# Patient Record
Sex: Male | Born: 1970 | Race: White | Hispanic: No | Marital: Married | Smoking: Former smoker
Health system: Southern US, Community
[De-identification: ages and names within clinical notes are randomized; demographics above are authoritative.]

## PROBLEM LIST (undated history)

## (undated) DIAGNOSIS — I10 Essential (primary) hypertension: Secondary | ICD-10-CM

## (undated) DIAGNOSIS — R519 Headache, unspecified: Secondary | ICD-10-CM

## (undated) HISTORY — PX: HERNIA REPAIR: SHX51

## (undated) HISTORY — DX: Headache, unspecified: R51.9

## (undated) HISTORY — DX: Essential (primary) hypertension: I10

## (undated) HISTORY — PX: JOINT REPLACEMENT: SHX530

---

## 2014-03-04 ENCOUNTER — Ambulatory Visit (INDEPENDENT_AMBULATORY_CARE_PROVIDER_SITE_OTHER): Payer: BC Managed Care – PPO | Admitting: Emergency Medicine

## 2014-03-04 VITALS — BP 118/82 | HR 74 | Temp 98.0°F | Resp 18 | Ht 72.0 in | Wt 247.8 lb

## 2014-03-04 DIAGNOSIS — H66012 Acute suppurative otitis media with spontaneous rupture of ear drum, left ear: Secondary | ICD-10-CM

## 2014-03-04 MED ORDER — AMOXICILLIN-POT CLAVULANATE 875-125 MG PO TABS: 1.0000 | ORAL_TABLET | Freq: Two times a day (BID) | ORAL | Status: DC

## 2014-03-04 MED ORDER — ACETAMINOPHEN-CODEINE #3 300-30 MG PO TABS: 1.0000 | ORAL_TABLET | ORAL | Status: DC | PRN

## 2014-03-04 NOTE — Progress Notes (Signed)
[facility] [address], [place] [id number] [phone]  Date:  03/04/2014   Name:  Leonard BreedingJeffrey Ngo   DOB:  Jun 23, 1970   MRN:  [id number]  PCP:  No PCP Per Patient    Chief Complaint: Otitis Media   History of Present Illness:  Leonard BreedingJeffrey Labrake is a 43 y.o. very pleasant male patient who presents with the following:  Several week history of a "cold".  Now worsening.  Has pain in left ear today Put hot oil in the ear and says it started to drain. No fevre or chills. Nasal congestion and mucopurulent drainage No cough, wheezing or shortness of breath. No improvement with over the counter medications or other home remedies.  Denies other complaint or health concern today.   There are no active problems to display for this patient.   History reviewed. No pertinent past medical history.  Past Surgical History  Procedure Laterality Date  . Hernia repair    . Joint replacement      History  Substance Use Topics  . Smoking status: Never Smoker   . Smokeless tobacco: Not on file  . Alcohol Use: No    Family History  Problem Relation Age of Onset  . Heart disease Mother   . Hyperlipidemia Mother   . Hypertension Mother   . Stroke Mother     No Known Allergies  Medication list has been reviewed and updated.  No current outpatient prescriptions on file prior to visit.   No current facility-administered medications on file prior to visit.    Review of Systems:  As per HPI, otherwise negative.    Physical Examination: Filed Vitals:   03/04/14 1053  BP: 118/82  Pulse: 74  Temp: 98 F (36.7 C)  Resp: 18   Filed Vitals:   03/04/14 1053  Height: 6' (1.829 m)  Weight: 247 lb 12.8 oz (112.401 kg)   Body mass index is 33.6 kg/(m^2). Ideal Body Weight: Weight in (lb) to have BMI = 25: 183.9  GEN: WDWN, NAD, Non-toxic, A & O x 3 HEENT: Atraumatic, Normocephalic. Neck supple. No masses, No LAD. Ears and Nose: No external deformity.   LEFT TM ruptured with purulent drainage CV: RRR, No M/G/R. No JVD. No thrill. No extra heart sounds. PULM: CTA B, no wheezes, crackles, rhonchi. No retractions. No resp. distress. No accessory muscle use. ABD: S, NT, ND, +BS. No rebound. No HSM. EXTR: No c/c/e NEURO Normal gait.  PSYCH: Normally interactive. Conversant. Not depressed or anxious appearing.  Calm demeanor.    Assessment and Plan: Acute OM with ruptured TM  Signed,  Phillips OdorJeffery Rivan Siordia, MD

## 2014-03-04 NOTE — Patient Instructions (Signed)
Tm Otitis Media Otitis media is redness, soreness, and inflammation of the middle ear. Otitis media may be caused by allergies or, most commonly, by infection. Often it occurs as a complication of the common cold. SIGNS AND SYMPTOMS Symptoms of otitis media may include:  Earache.  Fever.  Ringing in your ear.  Headache.  Leakage of fluid from the ear. DIAGNOSIS To diagnose otitis media, your health care provider will examine your ear with an otoscope. This is an instrument that allows your health care provider to see into your ear in order to examine your eardrum. Your health care provider also will ask you questions about your symptoms. TREATMENT  Typically, otitis media resolves on its own within 3-5 days. Your health care provider may prescribe medicine to ease your symptoms of pain. If otitis media does not resolve within 5 days or is recurrent, your health care provider may prescribe antibiotic medicines if he or she suspects that a bacterial infection is the cause. HOME CARE INSTRUCTIONS   If you were prescribed an antibiotic medicine, finish it all even if you start to feel better.  Take medicines only as directed by your health care provider.  Keep all follow-up visits as directed by your health care provider. SEEK MEDICAL CARE IF:  You have otitis media only in one ear, or bleeding from your nose, or both.  You notice a lump on your neck.  You are not getting better in 3-5 days.  You feel worse instead of better. SEEK IMMEDIATE MEDICAL CARE IF:   You have pain that is not controlled with medicine.  You have swelling, redness, or pain around your ear or stiffness in your neck.  You notice that part of your face is paralyzed.  You notice that the bone behind your ear (mastoid) is tender when you touch it. MAKE SURE YOU:   Understand these instructions.  Will watch your condition.  Will get help right away if you are not doing well or get worse. Document  Released: 12/29/2003 Document Revised: 08/09/2013 Document Reviewed: 10/20/2012 [facility] Patient Information 2015 [employer] [place]. This information is not intended to replace advice given to you by your health care provider. Make sure you discuss any questions you have with your health care provider.

## 2014-11-13 ENCOUNTER — Ambulatory Visit (INDEPENDENT_AMBULATORY_CARE_PROVIDER_SITE_OTHER): Payer: BLUE CROSS/BLUE SHIELD | Admitting: Emergency Medicine

## 2014-11-13 VITALS — BP 100/80 | HR 65 | Temp 98.2°F | Ht 72.5 in | Wt 260.2 lb

## 2014-11-13 DIAGNOSIS — G44209 Tension-type headache, unspecified, not intractable: Secondary | ICD-10-CM

## 2014-11-13 DIAGNOSIS — M545 Low back pain, unspecified: Secondary | ICD-10-CM

## 2014-11-13 LAB — POCT UA - MICROSCOPIC ONLY
Bacteria, U Microscopic: NEGATIVE
Casts, Ur, LPF, POC: NEGATIVE
Crystals, Ur, HPF, POC: NEGATIVE
Mucus, UA: POSITIVE
RBC, urine, microscopic: NEGATIVE
Yeast, UA: NEGATIVE

## 2014-11-13 LAB — POCT URINALYSIS DIPSTICK
Blood, UA: NEGATIVE
GLUCOSE UA: NEGATIVE
Leukocytes, UA: NEGATIVE
Nitrite, UA: NEGATIVE
Spec Grav, UA: 1.02
Urobilinogen, UA: 0.2
pH, UA: 6.5

## 2014-11-13 MED ORDER — NAPROXEN SODIUM 550 MG PO TABS: 550.0000 mg | ORAL_TABLET | Freq: Two times a day (BID) | ORAL | Status: DC

## 2014-11-13 MED ORDER — BUTALBITAL-APAP-CAFFEINE 50-325-40 MG PO TABS: 1.0000 | ORAL_TABLET | Freq: Four times a day (QID) | ORAL | Status: DC | PRN

## 2014-11-13 MED ORDER — CYCLOBENZAPRINE HCL 10 MG PO TABS
10.0000 mg | ORAL_TABLET | Freq: Three times a day (TID) | ORAL | Status: DC | PRN
Start: 2014-11-13 — End: 2015-01-14
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
## 2014-11-13 NOTE — Patient Instructions (Signed)
Lumbosacral Strain Lumbosacral strain is a strain of any of the parts that make up your lumbosacral vertebrae. Your lumbosacral vertebrae are the bones that make up the lower third of your backbone. Your lumbosacral vertebrae are held together by muscles and tough, fibrous tissue (ligaments).  CAUSES  A sudden blow to your back can cause lumbosacral strain. Also, anything that causes an excessive stretch of the muscles in the low back can cause this strain. This is typically seen when people exert themselves strenuously, fall, lift heavy objects, bend, or crouch repeatedly. RISK FACTORS  Physically demanding work.  Participation in pushing or pulling sports or sports that require a sudden twist of the back (tennis, golf, baseball).  Weight lifting.  Excessive lower back curvature.  Forward-tilted pelvis.  Weak back or abdominal muscles or both.  Tight hamstrings. SIGNS AND SYMPTOMS  Lumbosacral strain may cause pain in the area of your injury or pain that moves (radiates) down your leg.  DIAGNOSIS Your health care provider can often diagnose lumbosacral strain through a physical exam. In some cases, you may need tests such as X-ray exams.  TREATMENT  Treatment for your lower back injury depends on many factors that your clinician will have to evaluate. However, most treatment will include the use of anti-inflammatory medicines. HOME CARE INSTRUCTIONS   Avoid hard physical activities (tennis, racquetball, waterskiing) if you are not in proper physical condition for it. This may aggravate or create problems.  If you have a back problem, avoid sports requiring sudden body movements. Swimming and walking are generally safer activities.  Maintain good posture.  Maintain a healthy weight.  For acute conditions, you may put ice on the injured area.  Put ice in a plastic bag.  Place a towel between your skin and the bag.  Leave the ice on for 20 minutes, 2-3 times a day.  When the  low back starts healing, stretching and strengthening exercises may be recommended. SEEK MEDICAL CARE IF:  Your back pain is getting worse.  You experience severe back pain not relieved with medicines. SEEK IMMEDIATE MEDICAL CARE IF:   You have numbness, tingling, weakness, or problems with the use of your arms or legs.  There is a change in bowel or bladder control.  You have increasing pain in any area of the body, including your belly (abdomen).  You notice shortness of breath, dizziness, or feel faint.  You feel sick to your stomach (nauseous), are throwing up (vomiting), or become sweaty.  You notice discoloration of your toes or legs, or your feet get very cold. MAKE SURE YOU:   Understand these instructions.  Will watch your condition.  Will get help right away if you are not doing well or get worse. Document Released: 01/02/2005 Document Revised: 03/30/2013 Document Reviewed: 11/11/2012 [facility] Patient Information 2015 [place], [place]. This information is not intended to replace advice given to you by your health care provider. Make sure you discuss any questions you have with your health care provider. Tension Headache A tension headache is a feeling of pain, pressure, or aching often felt over the front and sides of the head. The pain can be dull or can feel tight (constricting). It is the most common type of headache. Tension headaches are not normally associated with nausea or vomiting and do not get worse with physical activity. Tension headaches can last 30 minutes to several days.  CAUSES  The exact cause is not known, but it may be caused by chemicals and hormones  in the brain that lead to pain. Tension headaches often begin after stress, anxiety, or depression. Other triggers may include:  Alcohol.  Caffeine (too much or withdrawal).  Respiratory infections (colds, flu, sinus infections).  Dental problems or teeth clenching.  Fatigue.  Holding your head  and neck in one position too long while using a computer. SYMPTOMS   Pressure around the head.   Dull, aching head pain.   Pain felt over the front and sides of the head.   Tenderness in the muscles of the head, neck, and shoulders. DIAGNOSIS  A tension headache is often diagnosed based on:   Symptoms.   Physical examination.   A CT scan or MRI of your head. These tests may be ordered if symptoms are severe or unusual. TREATMENT  Medicines may be given to help relieve symptoms.  HOME CARE INSTRUCTIONS   Only take over-the-counter or prescription medicines for pain or discomfort as directed by your caregiver.   Lie down in a dark, quiet room when you have a headache.   Keep a journal to find out what may be triggering your headaches. For example, write down:  What you eat and drink.  How much sleep you get.  Any change to your diet or medicines.  Try massage or other relaxation techniques.   Ice packs or heat applied to the head and neck can be used. Use these 3 to 4 times per day for 15 to 20 minutes each time, or as needed.   Limit stress.   Sit up straight, and do not tense your muscles.   Quit smoking if you smoke.  Limit alcohol use.  Decrease the amount of caffeine you drink, or stop drinking caffeine.  Eat and exercise regularly.  Get 7 to 9 hours of sleep, or as recommended by your caregiver.  Avoid excessive use of pain medicine as recurrent headaches can occur.  SEEK MEDICAL CARE IF:   You have problems with the medicines you were prescribed.  Your medicines do not work.  You have a change from the usual headache.  You have nausea or vomiting. SEEK IMMEDIATE MEDICAL CARE IF:   Your headache becomes severe.  You have a fever.  You have a stiff neck.  You have loss of vision.  You have muscular weakness or loss of muscle control.  You lose your balance or have trouble walking.  You feel faint or pass out.  You have  severe symptoms that are different from your first symptoms. MAKE SURE YOU:   Understand these instructions.  Will watch your condition.  Will get help right away if you are not doing well or get worse. Document Released: 03/25/2005 Document Revised: 06/17/2011 Document Reviewed: 03/15/2011 [facility] Patient Information 2015 [place], [place]. This information is not intended to replace advice given to you by your health care provider. Make sure you discuss any questions you have with your health care provider.

## 2015-01-07 ENCOUNTER — Ambulatory Visit (INDEPENDENT_AMBULATORY_CARE_PROVIDER_SITE_OTHER): Payer: BLUE CROSS/BLUE SHIELD | Admitting: Family Medicine

## 2015-01-07 VITALS — BP 122/70 | HR 79 | Temp 98.6°F | Resp 16 | Ht 72.5 in | Wt 272.0 lb

## 2015-01-07 DIAGNOSIS — J209 Acute bronchitis, unspecified: Secondary | ICD-10-CM | POA: Diagnosis not present

## 2015-01-07 MED ORDER — ALBUTEROL SULFATE HFA 108 (90 BASE) MCG/ACT IN AERS: 2.0000 | INHALATION_SPRAY | RESPIRATORY_TRACT | Status: DC | PRN

## 2015-01-07 MED ORDER — AZITHROMYCIN 250 MG PO TABS: ORAL_TABLET | ORAL | Status: DC

## 2015-01-07 MED ORDER — HYDROCODONE-HOMATROPINE 5-1.5 MG/5ML PO SYRP
5.0000 mL | ORAL_SOLUTION | Freq: Three times a day (TID) | ORAL | Status: DC | PRN
Start: 2015-01-07 — End: 2015-01-14

## 2015-01-07 NOTE — Progress Notes (Signed)
Patient ID: Leonard Villegas MRN: [id number], DOB: [birth year], 44 y.o. Date of Encounter: 01/07/2015, 1:42 PM  Primary Physician: No PCP Per Patient  Chief Complaint:  Chief Complaint  Patient presents with  . Cough    x 3 weeks  . Nasal Congestion    HPI: 44 y.o. year old male presents with a 21 day history of nasal congestion, post nasal drip, sore throat, and cough. Mild sinus pressure. Afebrile. No chills. Nasal congestion thick and green/yellow. Cough is productive of green/yellow sputum and not associated with time of day. Ears feel full, leading to sensation of muffled hearing. Has tried OTC cold preps without success. No GI complaints.   No sick contacts, recent antibiotics, or recent travels.   No leg trauma, sedentary periods, h/o cancer, or tobacco use.  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  History reviewed. No pertinent past medical history.   Home Meds: Prior to Admission medications   Medication Sig Start Date End Date Taking? Authorizing Provider  butalbital-acetaminophen-caffeine (FIORICET) 50-325-40 MG per tablet Take 1-2 tablets by mouth every 6 (six) hours as needed for headache. 11/13/14 11/13/15 Yes Carmelina Dane, MD  cyclobenzaprine (FLEXERIL) 10 MG tablet Take 1 tablet (10 mg total) by mouth 3 (three) times daily as needed for muscle spasms. 11/13/14  Yes Carmelina Dane, MD  ibuprofen (ADVIL,MOTRIN) 200 MG tablet Take 200 mg by mouth every 6 (six) hours as needed.   Yes Historical Provider, MD  naproxen sodium (ANAPROX DS) 550 MG tablet Take 1 tablet (550 mg total) by mouth 2 (two) times daily with a meal. 11/13/14 11/13/15 Yes Carmelina Dane, MD  albuterol (PROVENTIL HFA;VENTOLIN HFA) 108 (90 BASE) MCG/ACT inhaler Inhale 2 puffs into the lungs every 4 (four) hours as needed for wheezing or shortness of breath (cough, shortness of breath or wheezing.). 01/07/15   Elvina Sidle, MD  azithromycin (ZITHROMAX) 250 MG tablet Take 2 tabs  PO x 1 dose, then 1 tab PO QD x 4 days 01/07/15   Elvina Sidle, MD  HYDROcodone-homatropine [facility]) 5-1.5 MG/5ML syrup Take 5 mLs by mouth every 8 (eight) hours as needed for cough. 01/07/15   Elvina Sidle, MD    Allergies: No Known Allergies  Social History   Social History  . Marital Status: Single    Spouse Name: N/A  . Number of Children: N/A  . Years of Education: N/A   Occupational History  . Not on file.   Social History Main Topics  . Smoking status: Never Smoker   . Smokeless tobacco: Not on file  . Alcohol Use: No  . Drug Use: No  . Sexual Activity: Not on file   Other Topics Concern  . Not on file   Social History Narrative     Review of Systems: Constitutional: negative for chills, fever, night sweats or weight changes Cardiovascular: negative for chest pain or palpitations Respiratory: negative for hemoptysis, wheezing, or shortness of breath Abdominal: negative for abdominal pain, nausea, vomiting or diarrhea Dermatological: negative for rash Neurologic: negative for headache   Physical Exam: Blood pressure 122/70, pulse 79, temperature 98.6 F (37 C), temperature source Oral, resp. rate 16, height 6' 0.5" (1.842 m), weight 272 lb (123.378 kg), SpO2 97 %., Body mass index is 36.36 kg/(m^2). General: Well developed, well nourished, in no acute distress. Head: Normocephalic, atraumatic, eyes without discharge, sclera non-icteric, nares are congested. Bilateral auditory canals clear, TM's are without perforation, pearly grey with reflective cone of  light bilaterally. No sinus TTP. Oral cavity moist, dentition normal. Posterior pharynx with post nasal drip and mild erythema. No peritonsillar abscess or tonsillar exudate. Neck: Supple. No thyromegaly. Full ROM. No lymphadenopathy. Lungs: Coarse breath sounds bilaterally without wheezes, rales, or rhonchi. Breathing is unlabored.  Heart: RRR with S1 S2. No murmurs, rubs, or gallops appreciated. Msk:   Strength and tone normal for age. Extremities: No clubbing or cyanosis. No edema. Neuro: Alert and oriented X 3. Moves all extremities spontaneously. CNII-XII grossly in tact. Psych:  Responds to questions appropriately with a normal affect.   Labs:   ASSESSMENT AND PLAN:  44 y.o. year old male with bronchitis. -   ICD-9-CM ICD-10-CM   1. Acute bronchitis, unspecified organism 466.0 J20.9 azithromycin (ZITHROMAX) 250 MG tablet     albuterol (PROVENTIL HFA;VENTOLIN HFA) 108 (90 BASE) MCG/ACT inhaler     HYDROcodone-homatropine (HYCODAN) 5-1.5 MG/5ML syrup   -Tylenol/Motrin prn -Rest/fluids -RTC precautions -RTC 3-5 days if no improvement  Signed, Elvina Sidle, MD 01/07/2015 1:42 PM

## 2015-01-07 NOTE — Patient Instructions (Signed)

## 2015-01-11 ENCOUNTER — Telehealth: Payer: Self-pay

## 2015-01-11 DIAGNOSIS — J209 Acute bronchitis, unspecified: Secondary | ICD-10-CM

## 2015-01-11 MED ORDER — AZITHROMYCIN 250 MG PO TABS: ORAL_TABLET | ORAL | Status: DC

## 2015-01-11 NOTE — Telephone Encounter (Signed)
Pt states he forgot and left his last antibiotic at home and is still coughing a lot would like to have another round of the medicine and he can't afford to come back in Please call pt at [phone] and when we call him back, he would like to give [place] a different pharmacy [place]

## 2015-01-12 NOTE — Telephone Encounter (Signed)
What can we do for pt?

## 2015-01-13 NOTE — Telephone Encounter (Signed)
No refills.  Lets have patient try Zyrtec daily for now. Per Lauenstein's note he was advised to RTC in 3-5 days for recheck if no better.  Vitals were normal at last visit.  Risk of more antibiotic not outweighed by the benefit at this point.  Deliah Boston, MS, PA-C   10:03 AM, 01/13/2015

## 2015-01-14 ENCOUNTER — Telehealth: Payer: Self-pay

## 2015-01-14 ENCOUNTER — Ambulatory Visit (INDEPENDENT_AMBULATORY_CARE_PROVIDER_SITE_OTHER): Payer: BLUE CROSS/BLUE SHIELD | Admitting: Family Medicine

## 2015-01-14 DIAGNOSIS — H8110 Benign paroxysmal vertigo, unspecified ear: Secondary | ICD-10-CM | POA: Diagnosis not present

## 2015-01-14 DIAGNOSIS — R05 Cough: Secondary | ICD-10-CM | POA: Diagnosis not present

## 2015-01-14 DIAGNOSIS — J209 Acute bronchitis, unspecified: Secondary | ICD-10-CM | POA: Diagnosis not present

## 2015-01-14 DIAGNOSIS — R059 Cough, unspecified: Secondary | ICD-10-CM

## 2015-01-14 MED ORDER — ALBUTEROL SULFATE HFA 108 (90 BASE) MCG/ACT IN AERS: 2.0000 | INHALATION_SPRAY | RESPIRATORY_TRACT | Status: DC | PRN

## 2015-01-14 MED ORDER — CEFDINIR 300 MG PO CAPS: 600.0000 mg | ORAL_CAPSULE | Freq: Every day | ORAL | Status: DC

## 2015-01-14 MED ORDER — MECLIZINE HCL 25 MG PO TABS: 25.0000 mg | ORAL_TABLET | Freq: Three times a day (TID) | ORAL | Status: DC | PRN

## 2015-01-14 NOTE — Progress Notes (Signed)
Patient ID: Leonard Villegas, male    DOB: [birth year]  Age: 44 y.o. MRN: [id number]  Chief Complaint  Patient presents with  . Dizziness    Onset today    Subjective:   Patient had a very strange episode for him this morning. He awoke and was going to get up and got extremely acutely dizzy. He had a room spinning sensation. After while he sat up to thinking that getting out might make it better, and he did transiently but then it got worse again. Periodically as he is moving around her tried to lay back down he has had recurrent dizziness. He has not had much in way of nausea. No vomiting. He still has a residual cough from last week. He never got his inhaler filled. He did take the antibiotics can call back for refill of antibiotics which is given him. However the pharmacy stated that the insurance currently refused to pay for azithromycin for him to cause he just a course of it. No other major associated symptoms. No ringing in the year. No headache. He does have a history of having had a number of ear surgeries when he was young.  He works [place] in [place], was down here to visit his girlfriend last night. Drinks alcohol though he did have a couple drinks on Wednesday night.  Current allergies, medications, problem list, past/family and social histories reviewed.  Objective:  BP 121/84 mmHg  Pulse 101  Temp(Src) 99.4 F (37.4 C) (Oral)  Resp 16  Ht 6' 0.5" (1.842 m)  Wt 265 lb (120.203 kg)  BMI 35.43 kg/m2  SpO2 98%  No major acute distress. He looks anxious. TMs both have a good deal of scarring. His eyes are PERRLA. Fundi benign. EOMs intact. When I laid him down he did develop left nystagmus. It was very transient. His throat is clear. Neck supple without nodes or thyromegaly. Chest clear. Heart regular without murmurs.  Assessment & Plan:   Assessment: 1. Benign paroxysmal positional vertigo, unspecified laterality   2. Cough   3. Acute bronchitis, unspecified organism        Plan:   Meds ordered this encounter  Medications  . cefdinir (OMNICEF) 300 MG capsule    Sig: Take 2 capsules (600 mg total) by mouth daily.    Dispense:  14 capsule    Refill:  0  . meclizine (ANTIVERT) 25 MG tablet    Sig: Take 1 tablet (25 mg total) by mouth 3 (three) times daily as needed.    Dispense:  30 tablet    Refill:  0  . albuterol (PROVENTIL HFA;VENTOLIN HFA) 108 (90 BASE) MCG/ACT inhaler    Sig: Inhale 2 puffs into the lungs every 4 (four) hours as needed for wheezing or shortness of breath (cough, shortness of breath or wheezing.).    Dispense:  1 Inhaler    Refill:  0         Patient Instructions  Take meclizine 25 mg 1 or 2 pills 3 times daily as needed for dizziness  Drink lots of fluids  Take Omnicef (cefdinir) one pill twice daily for infection  Use the inhaler as previously directed  Return if worse or not improving   Benign Positional Vertigo Vertigo is the feeling that you or your surroundings are moving when they are not. Benign positional vertigo is the most common form of vertigo. The cause of this condition is not serious (is benign). This condition is triggered by certain movements and positions (is  positional). This condition can be dangerous if it occurs while you are doing something that could endanger you or others, such as driving.  CAUSES In many cases, the cause of this condition is not known. It may be caused by a disturbance in an area of the inner ear that helps your brain to sense movement and balance. This disturbance can be caused by a viral infection (labyrinthitis), head injury, or repetitive motion. RISK FACTORS This condition is more likely to develop in:  Women.  People who are 86 years of age or older. SYMPTOMS Symptoms of this condition usually happen when you move your head or your eyes in different directions. Symptoms may start suddenly, and they usually last for less than a minute. Symptoms may  include:  Loss of balance and falling.  Feeling like you are spinning or moving.  Feeling like your surroundings are spinning or moving.  Nausea and vomiting.  Blurred vision.  Dizziness.  Involuntary eye movement (nystagmus). Symptoms can be mild and cause only slight annoyance, or they can be severe and interfere with daily life. Episodes of benign positional vertigo may return (recur) over time, and they may be triggered by certain movements. Symptoms may improve over time. DIAGNOSIS This condition is usually diagnosed by medical history and a physical exam of the head, neck, and ears. You may be referred to a health care provider who specializes in ear, nose, and throat (ENT) problems (otolaryngologist) or a provider who specializes in disorders of the nervous system (neurologist). You may have additional testing, including:  MRI.  A CT scan.  Eye movement tests. Your health care provider may ask you to change positions quickly while he or she watches you for symptoms of benign positional vertigo, such as nystagmus. Eye movement may be tested with an electronystagmogram (ENG), caloric stimulation, the Dix-Hallpike test, or the roll test.  An electroencephalogram (EEG). This records electrical activity in your brain.  Hearing tests. TREATMENT Usually, your health care provider will treat this by moving your head in specific positions to adjust your inner ear back to normal. Surgery may be needed in severe cases, but this is rare. In some cases, benign positional vertigo may resolve on its own in 2-4 weeks. HOME CARE INSTRUCTIONS Safety  Move slowly.Avoid sudden body or head movements.  Avoid driving.  Avoid operating heavy machinery.  Avoid doing any tasks that would be dangerous to you or others if a vertigo episode would occur.  If you have trouble walking or keeping your balance, try using a cane for stability. If you feel dizzy or unstable, sit down right  away.  Return to your normal activities as told by your health care provider. Ask your health care provider what activities are safe for you. General Instructions  Take over-the-counter and prescription medicines only as told by your health care provider.  Avoid certain positions or movements as told by your health care provider.  Drink enough fluid to keep your urine clear or pale yellow.  Keep all follow-up visits as told by your health care provider. This is important. SEEK MEDICAL CARE IF:  You have a fever.  Your condition gets worse or you develop new symptoms.  Your family or friends notice any behavioral changes.  Your nausea or vomiting gets worse.  You have numbness or a "pins and needles" sensation. SEEK IMMEDIATE MEDICAL CARE IF:  You have difficulty speaking or moving.  You are always dizzy.  You faint.  You develop severe headaches.  You have weakness in your legs or arms.  You have changes in your hearing or vision.  You develop a stiff neck.  You develop sensitivity to light.   This information is not intended to replace advice given to you by your health care provider. Make sure you discuss any questions you have with your health care provider.   Document Released: 12/31/2005 Document Revised: 12/14/2014 Document Reviewed: 07/18/2014 Elsevier Interactive Patient Education [employer]      Return if symptoms worsen or fail to improve.   Annell Canty, MD 01/14/2015

## 2015-01-14 NOTE — Patient Instructions (Signed)
Take meclizine 25 mg 1 or 2 pills 3 times daily as needed for dizziness  Drink lots of fluids  Take Omnicef (cefdinir) one pill twice daily for infection  Use the inhaler as previously directed  Return if worse or not improving   Benign Positional Vertigo Vertigo is the feeling that you or your surroundings are moving when they are not. Benign positional vertigo is the most common form of vertigo. The cause of this condition is not serious (is benign). This condition is triggered by certain movements and positions (is positional). This condition can be dangerous if it occurs while you are doing something that could endanger you or others, such as driving.  CAUSES In many cases, the cause of this condition is not known. It may be caused by a disturbance in an area of the inner ear that helps your brain to sense movement and balance. This disturbance can be caused by a viral infection (labyrinthitis), head injury, or repetitive motion. RISK FACTORS This condition is more likely to develop in:  Women.  People who are 54 years of age or older. SYMPTOMS Symptoms of this condition usually happen when you move your head or your eyes in different directions. Symptoms may start suddenly, and they usually last for less than a minute. Symptoms may include:  Loss of balance and falling.  Feeling like you are spinning or moving.  Feeling like your surroundings are spinning or moving.  Nausea and vomiting.  Blurred vision.  Dizziness.  Involuntary eye movement (nystagmus). Symptoms can be mild and cause only slight annoyance, or they can be severe and interfere with daily life. Episodes of benign positional vertigo may return (recur) over time, and they may be triggered by certain movements. Symptoms may improve over time. DIAGNOSIS This condition is usually diagnosed by medical history and a physical exam of the head, neck, and ears. You may be referred to a health care provider who  specializes in ear, nose, and throat (ENT) problems (otolaryngologist) or a provider who specializes in disorders of the nervous system (neurologist). You may have additional testing, including:  MRI.  A CT scan.  Eye movement tests. Your health care provider may ask you to change positions quickly while he or she watches you for symptoms of benign positional vertigo, such as nystagmus. Eye movement may be tested with an electronystagmogram (ENG), caloric stimulation, the Dix-Hallpike test, or the roll test.  An electroencephalogram (EEG). This records electrical activity in your brain.  Hearing tests. TREATMENT Usually, your health care provider will treat this by moving your head in specific positions to adjust your inner ear back to normal. Surgery may be needed in severe cases, but this is rare. In some cases, benign positional vertigo may resolve on its own in 2-4 weeks. HOME CARE INSTRUCTIONS Safety  Move slowly.Avoid sudden body or head movements.  Avoid driving.  Avoid operating heavy machinery.  Avoid doing any tasks that would be dangerous to you or others if a vertigo episode would occur.  If you have trouble walking or keeping your balance, try using a cane for stability. If you feel dizzy or unstable, sit down right away.  Return to your normal activities as told by your health care provider. Ask your health care provider what activities are safe for you. General Instructions  Take over-the-counter and prescription medicines only as told by your health care provider.  Avoid certain positions or movements as told by your health care provider.  Drink enough fluid to  keep your urine clear or pale yellow.  Keep all follow-up visits as told by your health care provider. This is important. SEEK MEDICAL CARE IF:  You have a fever.  Your condition gets worse or you develop new symptoms.  Your family or friends notice any behavioral changes.  Your nausea or vomiting  gets worse.  You have numbness or a "pins and needles" sensation. SEEK IMMEDIATE MEDICAL CARE IF:  You have difficulty speaking or moving.  You are always dizzy.  You faint.  You develop severe headaches.  You have weakness in your legs or arms.  You have changes in your hearing or vision.  You develop a stiff neck.  You develop sensitivity to light.   This information is not intended to replace advice given to you by your health care provider. Make sure you discuss any questions you have with your health care provider.   Document Released: 12/31/2005 Document Revised: 12/14/2014 Document Reviewed: 07/18/2014 Elsevier Interactive Patient Education [employer]

## 2015-01-14 NOTE — Telephone Encounter (Signed)
[facility] is calling because the patient needed a prescription for z pak and the insurance didn't cover it. They would like to know if that patient can be prescribed an alternative.

## 2015-01-18 NOTE — Telephone Encounter (Signed)
I do not know why this only got to my box today, the 11th when sent the 8th?? .  Regardless, it has been a week since Dr. Elbert EwingsL wrote the prescription.  Call patient.  Find out if still having problems.  If need ask Dr L for an RX since he saw the patient.

## 2015-01-19 NOTE — Telephone Encounter (Signed)
I have called patient,he did not answer, but it looks like this was already done, there is a Rx for Omnicef in the Rx module which was given just after the Zpack  To you FYI

## 2015-03-04 ENCOUNTER — Other Ambulatory Visit: Payer: Self-pay | Admitting: Family Medicine

## 2015-03-04 ENCOUNTER — Ambulatory Visit (INDEPENDENT_AMBULATORY_CARE_PROVIDER_SITE_OTHER): Payer: BLUE CROSS/BLUE SHIELD | Admitting: Family Medicine

## 2015-03-04 VITALS — BP 118/82 | HR 90 | Temp 99.0°F | Resp 16 | Ht 72.5 in | Wt 270.0 lb

## 2015-03-04 DIAGNOSIS — Z113 Encounter for screening for infections with a predominantly sexual mode of transmission: Secondary | ICD-10-CM | POA: Diagnosis not present

## 2015-03-04 NOTE — Patient Instructions (Signed)
I will be in touch with your results asap Call me if any other concerns

## 2015-03-04 NOTE — Progress Notes (Addendum)
[facility] [address], [place] [address] [phone]  Date:  03/04/2015   Name:  Leonard BreedingJeffrey Villegas   DOB:  [birth year]   MRN:  [id number]  PCP:  No PCP Per Patient    Chief Complaint: STD testing   History of Present Illness:  Leonard Villegas is a 44 y.o. very pleasant male patient who presents with the following:  Generally heathy established pt here today with concern of STI testing.   He thinks he could have been exposed to HSV in the past- he would like to be tested for that.    There are no active problems to display for this patient.   No past medical history on file.  Past Surgical History  Procedure Laterality Date  . Hernia repair    . Joint replacement      Social History  Substance Use Topics  . Smoking status: Never Smoker   . Smokeless tobacco: None  . Alcohol Use: No    Family History  Problem Relation Age of Onset  . Heart disease Mother   . Hyperlipidemia Mother   . Hypertension Mother   . Stroke Mother     No Known Allergies  Medication list has been reviewed and updated.  Current Outpatient Prescriptions on File Prior to Visit  Medication Sig Dispense Refill  . ibuprofen (ADVIL,MOTRIN) 200 MG tablet Take 200 mg by mouth every 6 (six) hours as needed.     No current facility-administered medications on file prior to visit.    Review of Systems:  As per HPI- otherwise negative.   Physical Examination: Filed Vitals:   03/04/15 1205  BP: 118/82  Pulse: 90  Temp: 99 F (37.2 C)  Resp: 16   Filed Vitals:   03/04/15 1205  Height: 6' 0.5" (1.842 m)  Weight: 270 lb (122.471 kg)   Body mass index is 36.1 kg/(m^2). Ideal Body Weight: Weight in (lb) to have BMI = 25: 186.5  GEN: WDWN, NAD, Non-toxic, A & O x 3 HEENT: Atraumatic, Normocephalic. Neck supple. No masses, No LAD. Ears and Nose: No external deformity. CV: RRR, No M/G/R. No JVD. No thrill. No extra heart sounds. PULM: CTA B, no wheezes,  crackles, rhonchi. No retractions. No resp. distress. No accessory muscle use. ABD: S, NT, ND, +BS. No rebound. No HSM. EXTR: No c/c/e NEURO Normal gait.  PSYCH: Normally interactive. Conversant. Not depressed or anxious appearing.  Calm demeanor.    Assessment and Plan: Routine screening for STI (sexually transmitted infection) - Plan: HSV(herpes simplex vrs) 1+2 ab-IgG  [facility] Here today seeking HSV testing only- declines other labs  Signed Abbe AmsterdamJessica Donielle Radziewicz, MD  Called 11/29- [facility] that labs are negative, I will mail him a copy Results for orders placed or performed in visit on 03/04/15  HSV(herpes simplex vrs) 1+2 ab-IgG  Result Value Ref Range   HSV 1 Glycoprotein G Ab, IgG <0.10 IV   HSV 2 Glycoprotein G Ab, IgG <0.10 IV

## 2015-03-06 ENCOUNTER — Telehealth: Payer: Self-pay

## 2015-03-06 NOTE — Telephone Encounter (Signed)
Is it possible to add on an HIV- please do so if we can and give him a call- [facility] test [facility] added (or not if cannot add)

## 2015-03-06 NOTE — Telephone Encounter (Signed)
Pt requesting we add HIV test to his lab work,he would like a call back letting Him know if we have done that   Best phone for pt is [phone]

## 2015-03-06 NOTE — Telephone Encounter (Signed)
[facility]

## 2015-03-07 ENCOUNTER — Encounter: Payer: Self-pay | Admitting: Family Medicine

## 2015-03-07 LAB — HSV(HERPES SIMPLEX VRS) I + II AB-IGG: HSV 1 Glycoprotein G Ab, IgG: <0.1 IV

## 2015-03-07 LAB — HIV ANTIBODY (ROUTINE TESTING W REFLEX): HIV 1&2 Ab, 4th Generation: NONREACTIVE

## 2015-06-10 ENCOUNTER — Ambulatory Visit (INDEPENDENT_AMBULATORY_CARE_PROVIDER_SITE_OTHER): Payer: BLUE CROSS/BLUE SHIELD | Admitting: Internal Medicine

## 2015-06-10 VITALS — BP 122/84 | HR 100 | Temp 98.7°F | Resp 16 | Ht 72.5 in | Wt 274.0 lb

## 2015-06-10 DIAGNOSIS — J988 Other specified respiratory disorders: Secondary | ICD-10-CM | POA: Diagnosis not present

## 2015-06-10 DIAGNOSIS — J22 Unspecified acute lower respiratory infection: Secondary | ICD-10-CM

## 2015-06-10 MED ORDER — PREDNISONE 20 MG PO TABS: ORAL_TABLET | ORAL | Status: DC

## 2015-06-10 MED ORDER — AZITHROMYCIN 500 MG PO TABS: 500.0000 mg | ORAL_TABLET | Freq: Every day | ORAL | Status: DC

## 2015-06-10 NOTE — Progress Notes (Signed)
   Subjective:  By signing my name below, I, Stann Oresung-Kai Tsai, attest that this documentation has been prepared under the direction and in the presence of Ellamae Siaobert Rosalyn Archambault, MD. Electronically Signed: Stann Oresung-Kai Tsai, Scribe. 06/10/2015 , 2:51 PM .  Patient was seen in Room 6 .   Patient ID: Leonard Villegas, male    DOB: 1971/02/11, 45 y.o.   MRN: [id number] Chief Complaint  Patient presents with  . Cough    x 2-3 wks, productive  . Shortness of Breath    sometimes over the past few weeks   HPI Leonard Villegas is a 45 y.o. male who [facility] of productive cough that started about 2-3 weeks ago. He also reports having some shortness of breath with some myalgia over the past few weeks. He's had URI troubles in the past. He also reports ear fullness in both of his ears (right worse than left). He denies fever, chills, facial swelling, or blowing out much. He denies being on any long term medications.   There are no active problems to display for this patient.   Current outpatient prescriptions:  .  azithromycin (ZITHROMAX) 500 MG tablet, Take 1 tablet (500 mg total) by mouth daily., Disp: 5 tablet, Rfl: 0 .  predniSONE (DELTASONE) 20 MG tablet, 3/3/2/2/1/1 single daily dose for 6 days, Disp: 12 tablet, Rfl: 0  Review of Systems  Constitutional: Positive for fatigue. Negative for fever, chills and activity change.  HENT: Positive for ear pain and hearing loss. Negative for congestion, facial swelling, rhinorrhea and sore throat.   Respiratory: Positive for cough and shortness of breath. Negative for wheezing.   Musculoskeletal: Positive for myalgias.      Objective:   Physical Exam  Constitutional: He is oriented to person, place, and time. He appears well-developed and well-nourished. No distress.  HENT:  Head: Normocephalic and atraumatic.  Right Ear: A middle ear effusion is present.  Left Ear: A middle ear effusion is present.  Nose: Nose normal.  Mouth/Throat:  Oropharynx is clear and moist and mucous membranes are normal.  Boggy turbinates  Eyes: EOM are normal. Pupils are equal, round, and reactive to light.  Neck: Neck supple. No thyromegaly present.  Cardiovascular: Normal rate.   Pulmonary/Chest: Effort normal. No respiratory distress.  Wild wheezing with expiration with mild rhonchi  Musculoskeletal: Normal range of motion.  Lymphadenopathy:    He has no cervical adenopathy.  Neurological: He is alert and oriented to person, place, and time.  Skin: Skin is warm and dry.  Psychiatric: He has a normal mood and affect. His behavior is normal.  Nursing note and vitals reviewed.  BP 122/84 mmHg  Pulse 100  Temp(Src) 98.7 F (37.1 C) (Oral)  Resp 16  Ht 6' 0.5" (1.842 m)  Wt 274 lb (124.286 kg)  BMI 36.63 kg/m2  SpO2 98%    Assessment & Plan:   No diagnosis found. Meds ordered this encounter  Medications  . azithromycin (ZITHROMAX) 500 MG tablet    Sig: Take 1 tablet (500 mg total) by mouth daily.    Dispense:  5 tablet    Refill:  0  . predniSONE (DELTASONE) 20 MG tablet    Sig: 3/3/2/2/1/1 single daily dose for 6 days    Dispense:  12 tablet    Refill:  0

## 2015-10-09 ENCOUNTER — Ambulatory Visit (INDEPENDENT_AMBULATORY_CARE_PROVIDER_SITE_OTHER): Payer: BLUE CROSS/BLUE SHIELD | Admitting: Physician Assistant

## 2015-10-09 ENCOUNTER — Ambulatory Visit: Payer: BLUE CROSS/BLUE SHIELD

## 2015-10-09 ENCOUNTER — Ambulatory Visit (INDEPENDENT_AMBULATORY_CARE_PROVIDER_SITE_OTHER): Payer: BLUE CROSS/BLUE SHIELD

## 2015-10-09 VITALS — BP 118/80 | HR 81 | Temp 98.4°F | Resp 18 | Ht 72.5 in | Wt 282.0 lb

## 2015-10-09 DIAGNOSIS — M25551 Pain in right hip: Secondary | ICD-10-CM | POA: Diagnosis not present

## 2015-10-09 DIAGNOSIS — M8560 Other cyst of bone, unspecified site: Secondary | ICD-10-CM | POA: Diagnosis not present

## 2015-10-09 MED ORDER — NAPROXEN 500 MG PO TABS: 500.0000 mg | ORAL_TABLET | Freq: Two times a day (BID) | ORAL | Status: DC
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
## 2015-10-09 NOTE — Patient Instructions (Signed)
     IF you received an x-ray today, you will receive an invoice from [facility]. Please contact [facility] at [phone] with questions or concerns regarding your invoice.   IF you received labwork today, you will receive an invoice from [facility]. Please contact [facility] at [phone] with questions or concerns regarding your invoice.   Our billing staff will not be able to assist you with questions regarding bills from these companies.  You will be contacted with the lab results as soon as they are available. The fastest way to get your results is to activate your My Chart account. Instructions are located on the last page of this paperwork. If you have not heard from us regarding the results in 2 weeks, please contact this office.      

## 2015-10-31 ENCOUNTER — Ambulatory Visit (INDEPENDENT_AMBULATORY_CARE_PROVIDER_SITE_OTHER): Payer: BLUE CROSS/BLUE SHIELD | Admitting: Urgent Care

## 2015-10-31 VITALS — BP 138/80 | HR 98 | Temp 98.3°F | Resp 18 | Ht 72.75 in | Wt 286.0 lb

## 2015-10-31 DIAGNOSIS — R51 Headache: Secondary | ICD-10-CM

## 2015-10-31 DIAGNOSIS — F411 Generalized anxiety disorder: Secondary | ICD-10-CM

## 2015-10-31 DIAGNOSIS — R519 Headache, unspecified: Secondary | ICD-10-CM

## 2015-10-31 LAB — CBC
HEMATOCRIT: 41.4 % (ref 38.5–50.0)
HEMOGLOBIN: 14.6 g/dL (ref 13.2–17.1)
MCH: 30.1 pg (ref 27.0–33.0)
MCHC: 35.3 g/dL (ref 32.0–36.0)
MCV: 85.4 fL (ref 80.0–100.0)
MPV: 11.1 fL (ref 7.5–12.5)
PLATELETS: 241 10*3/uL (ref 140–400)
RBC: 4.85 MIL/uL (ref 4.20–5.80)
RDW: 13.6 % (ref 11.0–15.0)
WBC: 6.5 10*3/uL (ref 3.8–10.8)

## 2015-10-31 LAB — TSH: TSH: 2.25 m[IU]/L (ref 0.40–4.50)

## 2015-10-31 LAB — COMPREHENSIVE METABOLIC PANEL
ALT: 83 U/L — AB (ref 9–46)
AST: 54 U/L — ABNORMAL HIGH (ref 10–40)
Albumin: 4.8 g/dL (ref 3.6–5.1)
Alkaline Phosphatase: 53 U/L (ref 40–115)
BUN: 14 mg/dL (ref 7–25)
CHLORIDE: 103 mmol/L (ref 98–110)
CO2: 20 mmol/L (ref 20–31)
CREATININE: 1.12 mg/dL (ref 0.60–1.35)
Calcium: 9.9 mg/dL (ref 8.6–10.3)
GLUCOSE: 114 mg/dL — AB (ref 65–99)
Potassium: 4.1 mmol/L (ref 3.5–5.3)
SODIUM: 138 mmol/L (ref 135–146)
TOTAL PROTEIN: 7.6 g/dL (ref 6.1–8.1)
Total Bilirubin: 0.9 mg/dL (ref 0.2–1.2)

## 2015-10-31 MED ORDER — LORAZEPAM 0.5 MG PO TABS: 0.5000 mg | ORAL_TABLET | Freq: Two times a day (BID) | ORAL | 0 refills | Status: DC | PRN

## 2015-10-31 MED ORDER — FLUOXETINE HCL 20 MG PO TABS: 20.0000 mg | ORAL_TABLET | Freq: Every day | ORAL | 3 refills | Status: DC
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
## 2015-10-31 NOTE — Patient Instructions (Addendum)
Start Prozac 20mg  once daily. Then in 2 weeks if you are not experiencing side effects, increase to 40mg  once daily (2 tablets). Follow up in 6 months.    Fluoxetine capsules or tablets (Depression/Mood Disorders) What is this medicine? FLUOXETINE (floo OX e teen) belongs to a class of drugs known as selective serotonin reuptake inhibitors (SSRIs). It helps to treat mood problems such as depression, obsessive compulsive disorder, and panic attacks. It can also treat certain eating disorders. This medicine may be used for other purposes; ask your health care provider or pharmacist if you have questions. What should I tell my health care provider before I take this medicine? They need to know if you have any of these conditions: -bipolar disorder or mania -diabetes -glaucoma -liver disease -psychosis -seizures -suicidal thoughts or history of attempted suicide -an unusual or allergic reaction to fluoxetine, other medicines, foods, dyes, or preservatives -pregnant or trying to get pregnant -breast-feeding How should I use this medicine? Take this medicine by mouth with a glass of water. Follow the directions on the prescription label. You can take this medicine with or without food. Take your medicine at regular intervals. Do not take it more often than directed. Do not stop taking this medicine suddenly except upon the advice of your doctor. Stopping this medicine too quickly may cause serious side effects or your condition may worsen. A special MedGuide will be given to you by the pharmacist with each prescription and refill. Be sure to read this information carefully each time. Talk to your pediatrician regarding the use of this medicine in children. While this drug may be prescribed for children as young as 7 years for selected conditions, precautions do apply. Overdosage: If you think you have taken too much of this medicine contact a poison control center or emergency room at once. NOTE:  This medicine is only for you. Do not share this medicine with others. What if I miss a dose? If you miss a dose, skip the missed dose and go back to your regular dosing schedule. Do not take double or extra doses. What may interact with this medicine? Do not take fluoxetine with any of the following medications: -other medicines containing fluoxetine, like Sarafem or Symbyax -cisapride -linezolid -MAOIs like Carbex, Eldepryl, Marplan, Nardil, and Parnate -methylene blue (injected into a vein) -pimozide -thioridazine This medicine may also interact with the following medications: -alcohol -aspirin and aspirin-like medicines -carbamazepine -certain medicines for depression, anxiety, or psychotic disturbances -certain medicines for migraine headaches like almotriptan, eletriptan, frovatriptan, naratriptan, rizatriptan, sumatriptan, zolmitriptan -digoxin -diuretics -fentanyl -flecainide -furazolidone -isoniazid -lithium -medicines for sleep -medicines that treat or prevent blood clots like warfarin, enoxaparin, and dalteparin -NSAIDs, medicines for pain and inflammation, like ibuprofen or naproxen -phenytoin -procarbazine -propafenone -rasagiline -ritonavir -supplements like St. John's wort, kava kava, valerian -tramadol -tryptophan -vinblastine This list may not describe all possible interactions. Give your health care provider a list of all the medicines, herbs, non-prescription drugs, or dietary supplements you use. Also tell them if you smoke, drink alcohol, or use illegal drugs. Some items may interact with your medicine. What should I watch for while using this medicine? Tell your doctor if your symptoms do not get better or if they get worse. Visit your doctor or health care professional for regular checks on your progress. Because it may take several weeks to see the full effects of this medicine, it is important to continue your treatment as prescribed by your  doctor. Patients and their families should  watch out for new or worsening thoughts of suicide or depression. Also watch out for sudden changes in feelings such as feeling anxious, agitated, panicky, irritable, hostile, aggressive, impulsive, severely restless, overly excited and hyperactive, or not being able to sleep. If this happens, especially at the beginning of treatment or after a change in dose, call your health care professional. Leonard Villegas may get drowsy or dizzy. Do not drive, use machinery, or do anything that needs mental alertness until you know how this medicine affects you. Do not stand or sit up quickly, especially if you are an older patient. This reduces the risk of dizzy or fainting spells. Alcohol may interfere with the effect of this medicine. Avoid alcoholic drinks. Your mouth may get dry. Chewing sugarless gum or sucking hard candy, and drinking plenty of water may help. Contact your doctor if the problem does not go away or is severe. This medicine may affect blood sugar levels. If you have diabetes, check with your doctor or health care professional before you change your diet or the dose of your diabetic medicine. What side effects may I notice from receiving this medicine? Side effects that you should report to your doctor or health care professional as soon as possible: -allergic reactions like skin rash, itching or hives, swelling of the face, lips, or tongue -breathing problems -confusion -eye pain, changes in vision -fast or irregular heart rate, palpitations -flu-like fever, chills, cough, muscle or joint aches and pains -seizures -suicidal thoughts or other mood changes -swelling or redness in or around the eye -tremors -trouble sleeping -unusual bleeding or bruising -unusually tired or weak -vomiting Side effects that usually do not require medical attention (report to your doctor or health care professional if they continue or are bothersome): -change in sex drive or  performance -diarrhea -dry mouth -flushing -headache -increased or decreased appetite -nausea -sweating This list may not describe all possible side effects. Call your doctor for medical advice about side effects. You may report side effects to FDA at 1-800-FDA-1088. Where should I keep my medicine? Keep out of the reach of children. Store at room temperature between 15 and 30 degrees C (59 and 86 degrees F). Throw away any unused medicine after the expiration date. NOTE: This sheet is a summary. It may not cover all possible information. If you have questions about this medicine, talk to your doctor, pharmacist, or health care provider.    2016, Elsevier/Gold Standard. (2014-03-18 12:40:07)    Lorazepam tablets What is this medicine? LORAZEPAM (lor A ze pam) is a benzodiazepine. It is used to treat anxiety. This medicine may be used for other purposes; ask your health care provider or pharmacist if you have questions. What should I tell my health care provider before I take this medicine? They need to know if you have any of these conditions: -alcohol or drug abuse problem -bipolar disorder, depression, psychosis or other mental health condition -glaucoma -kidney or liver disease -lung disease or breathing difficulties -myasthenia gravis -Parkinson's disease -seizures or a history of seizures -suicidal thoughts -an unusual or allergic reaction to lorazepam, other benzodiazepines, foods, dyes, or preservatives -pregnant or trying to get pregnant -breast-feeding How should I use this medicine? Take this medicine by mouth with a glass of water. Follow the directions on the prescription label. If it upsets your stomach, take it with food or milk. Take your medicine at regular intervals. Do not take it more often than directed. Do not stop taking except on the advice of your  doctor or health care professional. Talk to your pediatrician regarding the use of this medicine in  children. Special care may be needed. Overdosage: If you think you have taken too much of this medicine contact a poison control center or emergency room at once. NOTE: This medicine is only for you. Do not share this medicine with others. What if I miss a dose? If you miss a dose, take it as soon as you can. If it is almost time for your next dose, take only that dose. Do not take double or extra doses. What may interact with this medicine? -barbiturate medicines for inducing sleep or treating seizures, like phenobarbital -clozapine -medicines for depression, mental problems or psychiatric disturbances -medicines for sleep -phenytoin -probenecid -theophylline -valproic acid This list may not describe all possible interactions. Give your health care provider a list of all the medicines, herbs, non-prescription drugs, or dietary supplements you use. Also tell them if you smoke, drink alcohol, or use illegal drugs. Some items may interact with your medicine. What should I watch for while using this medicine? Visit your doctor or health care professional for regular checks on your progress. Your body may become dependent on this medicine, ask your doctor or health care professional if you still need to take it. However, if you have been taking this medicine regularly for some time, do not suddenly stop taking it. You must gradually reduce the dose or you may get severe side effects. Ask your doctor or health care professional for advice before increasing or decreasing the dose. Even after you stop taking this medicine it can still affect your body for several days. You may get drowsy or dizzy. Do not drive, use machinery, or do anything that needs mental alertness until you know how this medicine affects you. To reduce the risk of dizzy and fainting spells, do not stand or sit up quickly, especially if you are an older patient. Alcohol may increase dizziness and drowsiness. Avoid alcoholic drinks. Do  not treat yourself for coughs, colds or allergies without asking your doctor or health care professional for advice. Some ingredients can increase possible side effects. What side effects may I notice from receiving this medicine? Side effects that you should report to your doctor or health care professional as soon as possible: -changes in vision -confusion -depression -mood changes, excitability or aggressive behavior -movement difficulty, staggering or jerky movements -muscle cramps -restlessness -weakness or tiredness Side effects that usually do not require medical attention (report to your doctor or health care professional if they continue or are bothersome): -constipation or diarrhea -difficulty sleeping, nightmares -dizziness, drowsiness -headache -nausea, vomiting This list may not describe all possible side effects. Call your doctor for medical advice about side effects. You may report side effects to FDA at 1-800-FDA-1088. Where should I keep my medicine? Keep out of the reach of children. This medicine can be abused. Keep your medicine in a safe place to protect it from theft. Do not share this medicine with anyone. Selling or giving away this medicine is dangerous and against the law. This medicine may cause accidental overdose and death if taken by other adults, children, or pets. Mix any unused medicine with a substance like cat litter or coffee grounds. Then throw the medicine away in a sealed container like a sealed bag or a coffee can with a lid. Do not use the medicine after the expiration date. Store at room temperature between 20 and 25 degrees C (68 and 77 degrees F).  Protect from light. Keep container tightly closed. NOTE: This sheet is a summary. It may not cover all possible information. If you have questions about this medicine, talk to your doctor, pharmacist, or health care provider.    2016, Elsevier/Gold Standard. (2013-12-14 15:24:21)     IF you received  an x-ray today, you will receive an invoice from [facility]. Please contact [facility] at [phone] with questions or concerns regarding your invoice.   IF you received labwork today, you will receive an invoice from [employer] [facility]. Please contact [facility] at [phone] with questions or concerns regarding your invoice.   Our billing staff will not be able to assist you with questions regarding bills from these companies.  You will be contacted with the lab results as soon as they are available. The fastest way to get your results is to activate your My Chart account. Instructions are located on the last page of this paperwork. If you have not heard from [place] regarding the results in 2 weeks, please contact this office.

## 2015-12-30 ENCOUNTER — Ambulatory Visit (INDEPENDENT_AMBULATORY_CARE_PROVIDER_SITE_OTHER): Payer: BLUE CROSS/BLUE SHIELD | Admitting: Family Medicine

## 2015-12-30 ENCOUNTER — Ambulatory Visit (INDEPENDENT_AMBULATORY_CARE_PROVIDER_SITE_OTHER): Payer: BLUE CROSS/BLUE SHIELD

## 2015-12-30 VITALS — BP 118/72 | HR 90 | Temp 99.1°F | Resp 18 | Ht 72.75 in | Wt 278.0 lb

## 2015-12-30 DIAGNOSIS — M79672 Pain in left foot: Secondary | ICD-10-CM

## 2015-12-30 DIAGNOSIS — L84 Corns and callosities: Secondary | ICD-10-CM | POA: Diagnosis not present

## 2015-12-30 NOTE — Patient Instructions (Addendum)
Thank you for coming in today. I think you have a corn.  Use over the counter corn pads and follow up with podiatry as needed.   Corns and Calluses Corns are small areas of thickened skin that occur on the top, sides, or tip of a toe. They contain a cone-shaped core with a point that can press on a nerve below. This causes pain. Calluses are areas of thickened skin that can occur anywhere on the body including hands, fingers, palms, soles of the feet, and heels.Calluses are usually larger than corns.  CAUSES  Corns and calluses are caused by rubbing (friction) or pressure, such as from shoes that are too tight or do not fit properly.  RISK FACTORS Corns are more likely to develop in people who have toe deformities, such as hammer toes. Since calluses can occur with friction to any area of the skin, calluses are more likely to develop in people who:   Work with their hands.  Wear shoes that fit poorly, shoes that are too tight, or shoes that are high-heeled.  Have toes deformities. SYMPTOMS Symptoms of a corn or callus include:  A hard growth on the skin.   Pain or tenderness under the skin.   Redness and swelling.   Increased discomfort while wearing tight-fitting shoes. DIAGNOSIS  Corns and calluses may be diagnosed with a medical history and physical exam.  TREATMENT  Corns and calluses may be treated with:  Removing the cause of the friction or pressure. This may include:  Changing your shoes.  Wearing shoe inserts (orthotics) or other protective layers in your shoes, such as a corn pad.  Wearing gloves.  Medicines to help soften skin in the hardened, thickened areas.  Reducing the size of the corn or callus by removing the dead layers of skin.  Antibiotic medicines to treat infection.  Surgery, if a toe deformity is the cause. HOME CARE INSTRUCTIONS   Take medicines only as directed by your health care provider.  If you were prescribed an antibiotic,  finish all of it even if you start to feel better.  Wear shoes that fit well. Avoid wearing high-heeled shoes and shoes that are too tight or too loose.  Wear any padding, protective layers, gloves, or orthotics as directed by your health care provider.  Soak your hands or feet and then use a file or pumice stone to soften your corn or callus. Do this as directed by your health care provider.  Check your corn or callus every day for signs of infection. Watch for:  Redness, swelling, or pain.  Fluid, blood, or pus. SEEK MEDICAL CARE IF:   Your symptoms do not improve with treatment.  You have increased redness, swelling, or pain at the site of your corn or callus.  You have fluid, blood, or pus coming from your corn or callus.  You have new symptoms.   This information is not intended to replace advice given to you by your health care provider. Make sure you discuss any questions you have with your health care provider.   Document Released: 12/30/2003 Document Revised: 08/09/2014 Document Reviewed: 03/21/2014 Elsevier Interactive Patient Education 2016 [employer]    IF you received an x-ray today, you will receive [facility]. Please contact [facility] at [phone] with questions or concerns regarding your invoice.   IF you received labwork today, you will receive an invoice from [facility]. Please contact [facility] at [phone] with questions or concerns  regarding your invoice.   Our billing staff will not be able to assist you with questions regarding bills from these companies.  You will be contacted with the lab results as soon as they are available. The fastest way to get your results is to activate your My Chart account. Instructions are located on the last page of this paperwork. If you have not heard from [place] regarding the results in 2 weeks, please contact this office.

## 2015-12-30 NOTE — Progress Notes (Signed)
    [name] is a 45 y.o. male who [facility] for left lateral foot pain. Patient notes several week history of left lateral foot pain skin irritation. He notes peeling skin pain. He denies injuring or stepping on something and is worried about foreign body.    History reviewed. No pertinent past medical history. Past Surgical History:  Procedure Laterality Date  . HERNIA REPAIR    . JOINT REPLACEMENT     Social History  Substance Use Topics  . Smoking status: Former Smoker    Quit date: 06/09/1996  . Smokeless tobacco: Never Used  . Alcohol use No   ROS as above Medications: Current Outpatient Prescriptions  Medication Sig Dispense Refill  . FLUoxetine (PROZAC) 20 MG tablet Take 1 tablet (20 mg total) by mouth daily. (Patient not taking: Reported on 12/30/2015) 60 tablet 3  . LORazepam (ATIVAN) 0.5 MG tablet Take 1 tablet (0.5 mg total) by mouth 2 (two) times daily as needed for anxiety. (Patient not taking: Reported on 12/30/2015) 10 tablet 0   No current facility-administered medications for this visit.    No Known Allergies   Exam:  BP 118/72 (BP Location: Right Arm, Patient Position: Sitting, Cuff Size: Small)   Pulse 90   Temp 99.1 F (37.3 C) (Oral)   Resp 18   Ht 6' 0.75" (1.848 m)   Wt 278 lb (126.1 kg)   SpO2 96%   BMI 36.93 kg/m  Gen: Well NAD Left foot: Unremarkable except for a small callus at the lateral plantar fifth MTP. Minimally tender without any skin erythema induration or fluctuance.  No results found for this or any previous visit (from the past 24 hour(s)). Dg Foot Complete Left  Result Date: 12/30/2015 CLINICAL DATA:  Lateral left foot pain in the region of the fifth metatarsal for the past 3 weeks. No known injury. EXAM: LEFT FOOT - COMPLETE 3+ VIEW COMPARISON:  None. FINDINGS: Mild inferior posterior calcaneal spur formation. Mild dorsal tarsal spur formation and hyperostosis. Otherwise, normal appearing bones and soft  tissues. IMPRESSION: No acute abnormality. Mild calcaneal spur formation and mild dorsal tarsal spur formation and hyperostosis. Electronically Signed   By: Beckie SaltsSteven  Reid M.D.   On: 12/30/2015 11:23    Assessment and Plan: 45 y.o. male with occasional corn left lateral foot. Most likely etiology for pain. Discussed over-the-counter corn pads. Additionally refer to podiatry for further evaluation and treatment as needed.  Discussed warning signs or symptoms. Please see discharge instructions. Patient expresses understanding.

## 2016-01-15 ENCOUNTER — Ambulatory Visit: Payer: BLUE CROSS/BLUE SHIELD

## 2016-01-15 ENCOUNTER — Ambulatory Visit (INDEPENDENT_AMBULATORY_CARE_PROVIDER_SITE_OTHER): Payer: BLUE CROSS/BLUE SHIELD | Admitting: Podiatry

## 2016-01-15 ENCOUNTER — Encounter: Payer: Self-pay | Admitting: Podiatry

## 2016-01-15 DIAGNOSIS — R52 Pain, unspecified: Secondary | ICD-10-CM

## 2016-01-15 DIAGNOSIS — Q828 Other specified congenital malformations of skin: Secondary | ICD-10-CM

## 2016-01-15 NOTE — Patient Instructions (Signed)
Mr. Sibyl ParrChapman- It was nice to meet you today. If you have any questions, please feel free to call us at [phone].

## 2016-01-15 NOTE — Progress Notes (Signed)
   Subjective:    Patient ID: Leonard Villegas, male    DOB: 23-May-1970, 45 y.o.   MRN: [id number]  HPI  45 year old male presents the office today with complaints of a painful lesion the bottom of his left foot which is been ongoing over the last several weeks. Her x-ray performed on September 23 which was negative. He denies tobacco any foreign objects. Denies any redness or drainage or swelling from the area. No other complaints at this time.   Review of Systems  All other systems reviewed and are negative.      Objective:   Physical Exam General: AAO x3, NAD  Dermatological: On the left foot submetatarsal 5 is an annular hyperkeratotic lesion. Upon debridement is a punctate deep hyperkeratotic lesion. There is no evidence of pinpoint bleeding or evidence of verruca. No evidence of foreign body identified. No drainage or pus. No swelling erythema or ascending synovitis. No fluctuance or crepitus and no malodor. No other lesions identified at this time.  Vascular: Dorsalis Pedis artery and Posterior Tibial artery pedal pulses are 2/4 bilateral with immedate capillary fill time There is no pain with calf compression, swelling, warmth, erythema.   Neruologic: Grossly intact via light touch bilateral. Vibratory intact via tuning fork bilateral. Protective threshold with Semmes Wienstein monofilament intact to all pedal sites bilateral.   Musculoskeletal: Tenderness to palpation on the hyperkeratotic lesion left foot second metatarsal 5. No other areas of tenderness. MMT 5/5.  Gait: Unassisted, Nonantalgic.     Assessment & Plan:  45 year old male left second metatarsal 5 porokeratosis -Treatment options discussed including all alternatives, risks, and complications -Etiology of symptoms were discussed -X-rays and chart was reviewed. -Lesion was debrided to without complications or bleeding. Area was cleaned and a pad was placed followed by salicylic acid and a bandage. Post procedure  instructions were discussed. Monitor for infection. -Follow up in 3-4 weeks if symptoms continue or sooner if any issues are to arise. Call any questions or concerns meantime.  Ovid CurdMatthew Wagoner, DPM

## 2016-02-12 ENCOUNTER — Ambulatory Visit: Payer: BLUE CROSS/BLUE SHIELD | Admitting: Podiatry

## 2016-03-01 ENCOUNTER — Ambulatory Visit (INDEPENDENT_AMBULATORY_CARE_PROVIDER_SITE_OTHER): Payer: BLUE CROSS/BLUE SHIELD | Admitting: Family Medicine

## 2016-03-01 VITALS — BP 120/92 | HR 80 | Temp 97.8°F | Resp 16 | Ht 72.75 in | Wt 282.6 lb

## 2016-03-01 DIAGNOSIS — Z20818 Contact with and (suspected) exposure to other bacterial communicable diseases: Secondary | ICD-10-CM | POA: Diagnosis not present

## 2016-03-01 DIAGNOSIS — J069 Acute upper respiratory infection, unspecified: Secondary | ICD-10-CM | POA: Diagnosis not present

## 2016-03-01 DIAGNOSIS — R030 Elevated blood-pressure reading, without diagnosis of hypertension: Secondary | ICD-10-CM | POA: Diagnosis not present

## 2016-03-01 DIAGNOSIS — J029 Acute pharyngitis, unspecified: Secondary | ICD-10-CM | POA: Diagnosis not present

## 2016-03-01 LAB — POCT RAPID STREP A (OFFICE): RAPID STREP A SCREEN: NEGATIVE

## 2016-03-01 NOTE — Progress Notes (Signed)
Subjective:    Patient ID: Leonard Villegas, male    DOB: 1970/09/07, 45 y.o.   MRN: [id number]  HPI Leonard Villegas is a 45 y.o. male  Sore throat for past 3 days. Min sneezing, some congestion, cough. Wife's son had strep throat few weeks ago. No fever. Eating and drinking ok.  Some nasal congestion but sore throat worst sx.   Tx:  Pain reliever PM last night  Patient Active Problem List   Diagnosis Date Noted  . Corn of foot 12/30/2015   History reviewed. No pertinent past medical history. Past Surgical History:  Procedure Laterality Date  . HERNIA REPAIR    . JOINT REPLACEMENT     No Known Allergies Prior to Admission medications   Medication Sig Start Date End Date Taking? Authorizing Provider  FLUoxetine (PROZAC) 20 MG tablet Take 1 tablet (20 mg total) by mouth daily. Patient not taking: Reported on 03/01/2016 10/31/15   Wallis Bamberg, PA-C  LORazepam (ATIVAN) 0.5 MG tablet Take 1 tablet (0.5 mg total) by mouth 2 (two) times daily as needed for anxiety. Patient not taking: Reported on 03/01/2016 10/31/15   Wallis Bamberg, PA-C  Not taking these meds.   Social History   Social History  . Marital status: Single    Spouse name: N/A  . Number of children: N/A  . Years of education: N/A   Occupational History  . Not on file.   Social History Main Topics  . Smoking status: Former Smoker    Quit date: 06/09/1996  . Smokeless tobacco: Never Used  . Alcohol use No  . Drug use: No  . Sexual activity: Not on file   Other Topics Concern  . Not on file   Social History Narrative  . No narrative on file    Review of Systems  Constitutional: Negative for fever.  HENT: Positive for congestion and sore throat.   Respiratory: Positive for cough. Negative for shortness of breath.   Hematological: Negative for adenopathy.       Objective:   Physical Exam  Constitutional: He is oriented to person, place, and time. He appears well-developed and well-nourished.  HENT:  Head:  Normocephalic and atraumatic.  Right Ear: Tympanic membrane, external ear and ear canal normal.  Left Ear: Tympanic membrane, external ear and ear canal normal.  Nose: No rhinorrhea.  Mouth/Throat: Oropharynx is clear and moist and mucous membranes are normal. No oropharyngeal exudate or posterior oropharyngeal erythema.  Eyes: Conjunctivae are normal. Pupils are equal, round, and reactive to light.  Neck: Neck supple.  Cardiovascular: Normal rate, regular rhythm, normal heart sounds and intact distal pulses.   No murmur heard. Pulmonary/Chest: Effort normal and breath sounds normal. He has no wheezes. He has no rhonchi. He has no rales.  Abdominal: Soft. There is no tenderness.  Lymphadenopathy:    He has no cervical adenopathy.  Neurological: He is alert and oriented to person, place, and time.  Skin: Skin is warm and dry. No rash noted.  Psychiatric: He has a normal mood and affect. His behavior is normal.  Vitals reviewed.  Vitals:   03/01/16 1006  BP: (!) 120/92  Pulse: 80  Resp: 16  Temp: 97.8 F (36.6 C)  TempSrc: Oral  SpO2: 97%  Weight: 282 lb 9.6 oz (128.2 kg)  Height: 6' 0.75" (1.848 m)   Results for orders placed or performed in visit on 03/01/16  POCT rapid strep A  Result Value Ref Range   Rapid Strep A Screen  Negative Negative        Assessment & Plan:   Leonard Villegas is a 45 y.o. male Acute upper respiratory infection Sore throat - Plan: POCT rapid strep A, Culture, Group A Strep Exposure to strep throat - Plan: POCT rapid strep A, Culture, Group A Strep  - Symptomatic care discussed, will check strep culture with exposure, but no concerning findings on exam for strep today, and negative rapid test.  RTC precautions discussed.  Elevated blood pressure reading  - Borderline, asymptomatic.  history of hypertension but off meds. Monitor outside blood pressure readings, and if remains elevated, return for recheck. Avoid decongestants or other cold  medicines that may also increase his blood pressure.   No orders of the defined types were placed in this encounter.  Patient Instructions    Your symptoms appear to be due to a virus today, but with the strep throat contacts, I will check some strep test. If those are positive, I will call you and will send in antibiotics. Otherwise see information below on treating sore throat and upper respiratory infections.  Keep a record of your blood pressures outside of the office and if remaining over 140/90  - return to discuss medications.   Return to the clinic or go to the nearest emergency room if any of your symptoms worsen or new symptoms occur.   Upper Respiratory Infection, Adult Most upper respiratory infections (URIs) are a viral infection of the air passages leading to the lungs. A URI affects the nose, throat, and upper air passages. The most common type of URI is nasopharyngitis and is typically referred to as "the common cold." URIs run their course and usually go away on their own. Most of the time, a URI does not require medical attention, but sometimes a bacterial infection in the upper airways can follow a viral infection. This is called a secondary infection. Sinus and middle ear infections are common types of secondary upper respiratory infections. Bacterial pneumonia can also complicate a URI. A URI can worsen asthma and chronic obstructive pulmonary disease (COPD). Sometimes, these complications can require emergency medical care and may be life threatening. What are the causes? Almost all URIs are caused by viruses. A virus is a type of germ and can spread from one person to another. What increases the risk? You may be at risk for a URI if:  You smoke.  You have chronic heart or lung disease.  You have a weakened defense (immune) system.  You are very young or very old.  You have nasal allergies or asthma.  You work in crowded or poorly ventilated areas.  You work  in health care facilities or schools. What are the signs or symptoms? Symptoms typically develop 2-3 days after you come in contact with a cold virus. Most viral URIs last 7-10 days. However, viral URIs from the influenza virus (flu virus) can last 14-18 days and are typically more severe. Symptoms may include:  Runny or stuffy (congested) nose.  Sneezing.  Cough.  Sore throat.  Headache.  Fatigue.  Fever.  Loss of appetite.  Pain in your forehead, behind your eyes, and over your cheekbones (sinus pain).  Muscle aches. How is this diagnosed? Your health care provider may diagnose a URI by:  Physical exam.  Tests to check that your symptoms are not due to another condition such as:  Strep throat.  Sinusitis.  Pneumonia.  Asthma. How is this treated? A URI goes away on its own with  time. It cannot be cured with medicines, but medicines may be prescribed or recommended to relieve symptoms. Medicines may help:  Reduce your fever.  Reduce your cough.  Relieve nasal congestion. Follow these instructions at home:  Take medicines only as directed by your health care provider.  Gargle warm saltwater or take cough drops to comfort your throat as directed by your health care provider.  Use a warm mist humidifier or inhale steam from a shower to increase air moisture. This may make it easier to breathe.  Drink enough fluid to keep your urine clear or pale yellow.  Eat soups and other clear broths and maintain good nutrition.  Rest as needed.  Return to work when your temperature has returned to normal or as your health care provider advises. You may need to stay home longer to avoid infecting others. You can also use a face mask and careful hand washing to prevent spread of the virus.  Increase the usage of your inhaler if you have asthma.  Do not use any tobacco products, including cigarettes, chewing tobacco, or electronic cigarettes. If you need help quitting,  ask your health care provider. How is this prevented? The best way to protect yourself from getting a cold is to practice good hygiene.  Avoid oral or hand contact with people with cold symptoms.  Wash your hands often if contact occurs. There is no clear evidence that vitamin C, vitamin E, echinacea, or exercise reduces the chance of developing a cold. However, it is always recommended to get plenty of rest, exercise, and practice good nutrition. Contact a health care provider if:  You are getting worse rather than better.  Your symptoms are not controlled by medicine.  You have chills.  You have worsening shortness of breath.  You have brown or red mucus.  You have yellow or brown nasal discharge.  You have pain in your face, especially when you bend forward.  You have a fever.  You have swollen neck glands.  You have pain while swallowing.  You have white areas in the back of your throat. Get help right away if:  You have severe or persistent:  Headache.  Ear pain.  Sinus pain.  Chest pain.  You have chronic lung disease and any of the following:  Wheezing.  Prolonged cough.  Coughing up blood.  A change in your usual mucus.  You have a stiff neck.  You have changes in your:  Vision.  Hearing.  Thinking.  Mood. This information is not intended to replace advice given to you by your health care provider. Make sure you discuss any questions you have with your health care provider. Document Released: 09/18/2000 Document Revised: 11/26/2015 Document Reviewed: 06/30/2013 Elsevier Interactive Patient Education  2017 Elsevier Inc.  Sore Throat A sore throat is pain, burning, irritation, or scratchiness in the throat. When you have a sore throat, you may feel pain or tenderness in your throat when you swallow or talk. Many things can cause a sore throat, including:  An infection.  Seasonal allergies.  Dryness in the air.  Irritants, such as  smoke or pollution.  Gastroesophageal reflux disease (GERD).  A tumor. A sore throat is often the first sign of another sickness. It may happen with other symptoms, such as coughing, sneezing, fever, and swollen neck glands. Most sore throats go away without medical treatment. Follow these instructions at home:  Take over-the-counter medicines only as told by your health care provider.  Drink enough fluids  to keep your urine clear or pale yellow.  Rest as needed.  To help with pain, try:  Sipping warm liquids, such as broth, herbal tea, or warm water.  Eating or drinking cold or frozen liquids, such as frozen ice pops.  Gargling with a salt-water mixture 3-4 times a day or as needed. To make a salt-water mixture, completely dissolve -1 tsp of salt in 1 cup of warm water.  Sucking on hard candy or throat lozenges.  Putting a cool-mist humidifier in your bedroom at night to moisten the air.  Sitting in the bathroom with the door closed for 5-10 minutes while you run hot water in the shower.  Do not use any tobacco products, such as cigarettes, chewing tobacco, and e-cigarettes. If you need help quitting, ask your health care provider. Contact a health care provider if:  You have a fever for more than 2-3 days.  You have symptoms that last (are persistent) for more than 2-3 days.  Your throat does not get better within 7 days.  You have a fever and your symptoms suddenly get worse. Get help right away if:  You have difficulty breathing.  You cannot swallow fluids, soft foods, or your saliva.  You have increased swelling in your throat or neck.  You have persistent nausea and vomiting. This information is not intended to replace advice given to you by your health care provider. Make sure you discuss any questions you have with your health care provider. Document Released: 05/02/2004 Document Revised: 11/19/2015 Document Reviewed: 01/13/2015 Elsevier Interactive Patient  Education  2017 [employer]   IF you received an x-ray today, you will receive an invoice from [facility]. Please contact [facility] at [phone] with questions or concerns regarding your invoice.   IF you received labwork today, you will receive an invoice from [facility]. Please contact [facility] at [phone] with questions or concerns regarding your invoice.   Our billing staff will not be able to assist you with questions regarding bills from these companies.  You will be contacted with the lab results as soon as they are available. The fastest way to get your results is to activate your My Chart account. Instructions are located on the last page of this paperwork. If you have not heard from us regarding the results in 2 weeks, please contact this office.        I personally performed the services described in this documentation, which was scribed in my presence. The recorded information has been reviewed and considered, and addended by me as needed.   Signed,   Meredith StaggersJeffrey Ihsan Nomura, MD [facility].  03/01/16 11:20 AM

## 2016-03-01 NOTE — Patient Instructions (Addendum)
Your symptoms appear to be due to a virus today, but with the strep throat contacts, I will check some strep test. If those are positive, I will call you and will send in antibiotics. Otherwise see information below on treating sore throat and upper respiratory infections.  Keep a record of your blood pressures outside of the office and if remaining over 140/90  - return to discuss medications.   Return to the clinic or go to the nearest emergency room if any of your symptoms worsen or new symptoms occur.   Upper Respiratory Infection, Adult Most upper respiratory infections (URIs) are a viral infection of the air passages leading to the lungs. A URI affects the nose, throat, and upper air passages. The most common type of URI is nasopharyngitis and is typically referred to as "the common cold." URIs run their course and usually go away on their own. Most of the time, a URI does not require medical attention, but sometimes a bacterial infection in the upper airways can follow a viral infection. This is called a secondary infection. Sinus and middle ear infections are common types of secondary upper respiratory infections. Bacterial pneumonia can also complicate a URI. A URI can worsen asthma and chronic obstructive pulmonary disease (COPD). Sometimes, these complications can require emergency medical care and may be life threatening. What are the causes? Almost all URIs are caused by viruses. A virus is a type of germ and can spread from one person to another. What increases the risk? You may be at risk for a URI if:  You smoke.  You have chronic heart or lung disease.  You have a weakened defense (immune) system.  You are very young or very old.  You have nasal allergies or asthma.  You work in crowded or poorly ventilated areas.  You work in health care facilities or schools. What are the signs or symptoms? Symptoms typically develop 2-3 days after you come in contact with a cold  virus. Most viral URIs last 7-10 days. However, viral URIs from the influenza virus (flu virus) can last 14-18 days and are typically more severe. Symptoms may include:  Runny or stuffy (congested) nose.  Sneezing.  Cough.  Sore throat.  Headache.  Fatigue.  Fever.  Loss of appetite.  Pain in your forehead, behind your eyes, and over your cheekbones (sinus pain).  Muscle aches. How is this diagnosed? Your health care provider may diagnose a URI by:  Physical exam.  Tests to check that your symptoms are not due to another condition such as:  Strep throat.  Sinusitis.  Pneumonia.  Asthma. How is this treated? A URI goes away on its own with time. It cannot be cured with medicines, but medicines may be prescribed or recommended to relieve symptoms. Medicines may help:  Reduce your fever.  Reduce your cough.  Relieve nasal congestion. Follow these instructions at home:  Take medicines only as directed by your health care provider.  Gargle warm saltwater or take cough drops to comfort your throat as directed by your health care provider.  Use a warm mist humidifier or inhale steam from a shower to increase air moisture. This may make it easier to breathe.  Drink enough fluid to keep your urine clear or pale yellow.  Eat soups and other clear broths and maintain good nutrition.  Rest as needed.  Return to work when your temperature has returned to normal or as your health care provider advises. You may need to stay  home longer to avoid infecting others. You can also use a face mask and careful hand washing to prevent spread of the virus.  Increase the usage of your inhaler if you have asthma.  Do not use any tobacco products, including cigarettes, chewing tobacco, or electronic cigarettes. If you need help quitting, ask your health care provider. How is this prevented? The best way to protect yourself from getting a cold is to practice good hygiene.  Avoid  oral or hand contact with people with cold symptoms.  Wash your hands often if contact occurs. There is no clear evidence that vitamin C, vitamin E, echinacea, or exercise reduces the chance of developing a cold. However, it is always recommended to get plenty of rest, exercise, and practice good nutrition. Contact a health care provider if:  You are getting worse rather than better.  Your symptoms are not controlled by medicine.  You have chills.  You have worsening shortness of breath.  You have brown or red mucus.  You have yellow or brown nasal discharge.  You have pain in your face, especially when you bend forward.  You have a fever.  You have swollen neck glands.  You have pain while swallowing.  You have white areas in the back of your throat. Get help right away if:  You have severe or persistent:  Headache.  Ear pain.  Sinus pain.  Chest pain.  You have chronic lung disease and any of the following:  Wheezing.  Prolonged cough.  Coughing up blood.  A change in your usual mucus.  You have a stiff neck.  You have changes in your:  Vision.  Hearing.  Thinking.  Mood. This information is not intended to replace advice given to you by your health care provider. Make sure you discuss any questions you have with your health care provider. Document Released: 09/18/2000 Document Revised: 11/26/2015 Document Reviewed: 06/30/2013 Elsevier Interactive Patient Education  2017 Elsevier Inc.  Sore Throat A sore throat is pain, burning, irritation, or scratchiness in the throat. When you have a sore throat, you may feel pain or tenderness in your throat when you swallow or talk. Many things can cause a sore throat, including:  An infection.  Seasonal allergies.  Dryness in the air.  Irritants, such as smoke or pollution.  Gastroesophageal reflux disease (GERD).  A tumor. A sore throat is often the first sign of another sickness. It may happen  with other symptoms, such as coughing, sneezing, fever, and swollen neck glands. Most sore throats go away without medical treatment. Follow these instructions at home:  Take over-the-counter medicines only as told by your health care provider.  Drink enough fluids to keep your urine clear or pale yellow.  Rest as needed.  To help with pain, try:  Sipping warm liquids, such as broth, herbal tea, or warm water.  Eating or drinking cold or frozen liquids, such as frozen ice pops.  Gargling with a salt-water mixture 3-4 times a day or as needed. To make a salt-water mixture, completely dissolve -1 tsp of salt in 1 cup of warm water.  Sucking on hard candy or throat lozenges.  Putting a cool-mist humidifier in your bedroom at night to moisten the air.  Sitting in the bathroom with the door closed for 5-10 minutes while you run hot water in the shower.  Do not use any tobacco products, such as cigarettes, chewing tobacco, and e-cigarettes. If you need help quitting, ask your health care provider. Contact  a health care provider if:  You have a fever for more than 2-3 days.  You have symptoms that last (are persistent) for more than 2-3 days.  Your throat does not get better within 7 days.  You have a fever and your symptoms suddenly get worse. Get help right away if:  You have difficulty breathing.  You cannot swallow fluids, soft foods, or your saliva.  You have increased swelling in your throat or neck.  You have persistent nausea and vomiting. This information is not intended to replace advice given to you by your health care provider. Make sure you discuss any questions you have with your health care provider. Document Released: 05/02/2004 Document Revised: 11/19/2015 Document Reviewed: 01/13/2015 Elsevier Interactive Patient Education  2017 [employer]   IF you received an x-ray today, you will receive an invoice from [facility]. Please contact [facility] at [phone] with questions or concerns regarding your invoice.   IF you received labwork today, you will receive an invoice from [facility]. Please contact [facility] at [phone] with questions or concerns regarding your invoice.   Our billing staff will not be able to assist you with questions regarding bills from these companies.  You will be contacted with the lab results as soon as they are available. The fastest way to get your results is to activate your My Chart account. Instructions are located on the last page of this paperwork. If you have not heard from us regarding the results in 2 weeks, please contact this office.

## 2016-03-02 LAB — CULTURE, GROUP A STREP: Organism ID, Bacteria: NORMAL

## 2016-03-09 ENCOUNTER — Ambulatory Visit (INDEPENDENT_AMBULATORY_CARE_PROVIDER_SITE_OTHER): Payer: BLUE CROSS/BLUE SHIELD | Admitting: Osteopathic Medicine

## 2016-03-09 VITALS — BP 132/90 | HR 100 | Temp 97.8°F | Resp 18 | Ht 72.0 in | Wt 284.0 lb

## 2016-03-09 DIAGNOSIS — R053 Chronic cough: Secondary | ICD-10-CM

## 2016-03-09 DIAGNOSIS — R05 Cough: Secondary | ICD-10-CM

## 2016-03-09 DIAGNOSIS — R058 Other specified cough: Secondary | ICD-10-CM

## 2016-03-09 MED ORDER — GUAIFENESIN-CODEINE 100-10 MG/5ML PO SOLN: 5.0000 mL | ORAL | 0 refills | Status: DC | PRN

## 2016-03-09 MED ORDER — AZITHROMYCIN 250 MG PO TABS: ORAL_TABLET | ORAL | 0 refills | Status: DC

## 2016-03-09 MED ORDER — IPRATROPIUM-ALBUTEROL 18-103 MCG/ACT IN AERO: 1.0000 | INHALATION_SPRAY | RESPIRATORY_TRACT | 0 refills | Status: DC | PRN

## 2016-03-09 MED ORDER — METHYLPREDNISOLONE 4 MG PO TBPK: ORAL_TABLET | ORAL | 0 refills | Status: DC

## 2016-03-09 MED ORDER — BENZONATATE 200 MG PO CAPS: 200.0000 mg | ORAL_CAPSULE | Freq: Three times a day (TID) | ORAL | 0 refills | Status: DC | PRN

## 2016-03-09 NOTE — Patient Instructions (Addendum)
I think your symptoms are more likely post-viral cough syndrome, rather than persistent or new infection. Steroids typically help with this issue, but antibiotics may be required if there is no improvement. If the steroids aren't helping after 1 - 3 days, fill the antibiotics. If you develop fever or more productive cough, fill the antibiotics and we will need to do a chest xray. Please contact us with any questions or concerns!     IF you received an x-ray today, you will receive an invoice from [facility]. Please contact [facility] at [phone] with questions or concerns regarding your invoice.   IF you received labwork today, you will receive an invoice from [facility]. Please contact [facility] at [phone] with questions or concerns regarding your invoice.   Our billing staff will not be able to assist you with questions regarding bills from these companies.  You will be contacted with the lab results as soon as they are available. The fastest way to get your results is to activate your My Chart account. Instructions are located on the last page of this paperwork. If you have not heard from us regarding the results in 2 weeks, please contact this office.

## 2016-03-09 NOTE — Progress Notes (Signed)
HPI: [name] is a 45 y.o. male  who presents to [facility] today, 03/09/16,  for chief complaint of:  Chief Complaint  Patient presents with  . Cough  . Breathing Problem    When taking deep breaths     . Location: chest . Quality: coughing, trouble taking deep breath without more coughing  . Duration: few weeks per patient, says it's been going on since his last visit here, seen recently for viral URI <2 weeks ago+ . Timing: worse in evening . Assoc signs/symptoms: Minimal productive cough, no fever    Past medical, surgical, social and family history reviewed: No past medical history on file. Past Surgical History:  Procedure Laterality Date  . HERNIA REPAIR    . JOINT REPLACEMENT     Social History  Substance Use Topics  . Smoking status: Former Smoker    Quit date: 06/09/1996  . Smokeless tobacco: Never Used  . Alcohol use No   Family History  Problem Relation Age of Onset  . Heart disease Mother   . Hyperlipidemia Mother   . Hypertension Mother   . Stroke Mother      Current medication list and allergy/intolerance information reviewed:   No current outpatient prescriptions on file.   No current facility-administered medications for this visit.    No Known Allergies    Review of Systems:  Constitutional:  No  fever, no chills, +recent illness, No unintentional weight changes. +significant fatigue.   HEENT: No  headache, no vision change, no hearing change, No sore throat anymor, No sinus pressure  Cardiac: No  chest  pressure, No palpitations, No  Orthopnea  Respiratory:  No  shortness of breath. +Cough  Gastrointestinal: No  abdominal pain, No  nausea,   Exam:  BP 132/90   Pulse 100   Temp 97.8 F (36.6 C) (Oral)   Resp 18   Ht 6' (1.829 m)   Wt 284 lb (128.8 kg)   SpO2 97%   BMI 38.52 kg/m   Constitutional: VS see above. General Appearance: alert, well-developed, well-nourished, NAD  Eyes: Normal lids and  conjunctive, non-icteric sclera  Ears, Nose, Mouth, Throat: MMM, Normal external inspection ears/nares/mouth/lips/gums. TM normal bilaterally. Pharynx/tonsils no erythema, no exudate. Nasal mucosa normal.   Neck: No masses, trachea midline. No thyroid enlargement. No tenderness/mass appreciated. No lymphadenopathy  Respiratory: Normal respiratory effort. no wheeze, no rhonchi, no rales  Cardiovascular: S1/S2 normal, no murmur, no rub/gallop auscultated. RRR.     ASSESSMENT/PLAN: post-viral cough syndrome more likely, pt declines CXR, abx to fill if no better see instructions    Post-viral cough syndrome - Plan: guaiFENesin-codeine 100-10 MG/5ML syrup, albuterol-ipratropium (COMBIVENT) 18-103 MCG/ACT inhaler, methylPREDNISolone (MEDROL DOSEPAK) 4 MG TBPK tablet, benzonatate (TESSALON) 200 MG capsule  Persistent cough - Plan: azithromycin (ZITHROMAX) 250 MG tablet  Patient Instructions   I think your symptoms are more likely post-viral cough syndrome, rather than persistent or new infection. Steroids typically help with this issue, but antibiotics may be required if there is no improvement. If the steroids aren't helping after 1 - 3 days, fill the antibiotics. If you develop fever or more productive cough, fill the antibiotics and we will need to do a chest xray. Please contact us with any questions or concerns!     IF you received an x-ray today, you will receive an invoice from [facility]. Please contact [facility] at [phone] with questions or concerns regarding your invoice.   IF you received  labwork today, you will receive an invoice from [facility]. Please contact [facility] at [phone] with questions or concerns regarding your invoice.   Our billing staff will not be able to assist you with questions regarding bills from these companies.  You will be contacted with the lab results as soon as they are available. The fastest way  to get your results is to activate your My Chart account. Instructions are located on the last page of this paperwork. If you have not heard from us regarding the results in 2 weeks, please contact this office.           Visit summary with medication list and pertinent instructions was printed for patient to review. All questions at time of visit were answered - patient instructed to contact office with any additional concerns. ER/RTC precautions were reviewed with the patient. Follow-up plan: Return if symptoms worsen or fail to improve.

## 2016-03-28 ENCOUNTER — Telehealth: Payer: Self-pay

## 2016-03-28 NOTE — Telephone Encounter (Signed)
PATIENT STATES HE WAS SEEN 3 WEEKS AGO FOR A COUGH. HE SAID THE 4 PILLS OF ANTIBOTIC THAT HE WAS GIVEN IS NOT HELPING AT ALL. HE WOULD LIKE SOMETHING ELSE CALLED INTO HIS PHARMACY. BEST PHONE [phone] (CELL)  PHARMACY CHOICE IS [facility] ON [address].  MBC

## 2016-03-28 NOTE — Telephone Encounter (Signed)
IC pt-LMOVM to RTC.  Advised he may need CXR as discussed at OV - or another type of ABX depending on his symptoms since it has been 3 weeks.

## 2016-04-12 ENCOUNTER — Ambulatory Visit (INDEPENDENT_AMBULATORY_CARE_PROVIDER_SITE_OTHER): Payer: BLUE CROSS/BLUE SHIELD | Admitting: Physician Assistant

## 2016-04-12 VITALS — BP 102/80 | HR 80 | Temp 97.9°F | Resp 18 | Ht 72.0 in | Wt 277.0 lb

## 2016-04-12 DIAGNOSIS — B9789 Other viral agents as the cause of diseases classified elsewhere: Secondary | ICD-10-CM | POA: Diagnosis not present

## 2016-04-12 DIAGNOSIS — Z630 Problems in relationship with spouse or partner: Secondary | ICD-10-CM

## 2016-04-12 DIAGNOSIS — J069 Acute upper respiratory infection, unspecified: Secondary | ICD-10-CM

## 2016-04-12 MED ORDER — CETIRIZINE-PSEUDOEPHEDRINE ER 5-120 MG PO TB12: 1.0000 | ORAL_TABLET | Freq: Two times a day (BID) | ORAL | 0 refills | Status: DC

## 2016-04-12 MED ORDER — AMOXICILLIN 875 MG PO TABS: 875.0000 mg | ORAL_TABLET | Freq: Two times a day (BID) | ORAL | 0 refills | Status: DC

## 2016-04-12 NOTE — Progress Notes (Signed)
  04/15/2016 7:09 PM   DOB: 1970/08/31 / MRN: [id number]  SUBJECTIVE:  [name] is a 46 y.o. male presenting for cough and scratchy throat that staretd two days ago.  Reports sinus itching, rhinorrhea.  Complains of mild subjective fever, chills.  He has not had the flu shot.  Denies sick contacts.  He has tried "some allergy pill similar to zyrtec." Reports this did not really help.  His wife wants to take some time apart.  She lost her brother about 4 months ago.  He reports she is still depressed.  He does not feel depressed, but is sad this relationship is in jeopardy. He is sleeping well.    Review of Systems  Respiratory: Positive for cough.   Gastrointestinal: Negative for nausea and vomiting.    The problem list and medications were reviewed and updated by myself where necessary and exist elsewhere in the encounter.   OBJECTIVE:  BP 102/80 (BP Location: Right Arm, Patient Position: Sitting, Cuff Size: Normal)   Pulse 80   Temp 97.9 F (36.6 C) (Oral)   Resp 18   Ht 6' (1.829 m)   Wt 277 lb (125.6 kg)   SpO2 97%   BMI 37.57 kg/m   Physical Exam  Constitutional: He is oriented to person, place, and time.  Cardiovascular: Normal rate and regular rhythm.   Pulmonary/Chest: Effort normal and breath sounds normal.  Musculoskeletal: Normal range of motion. He exhibits no edema.  Neurological: He is alert and oriented to person, place, and time. No cranial nerve deficit.  Skin: Skin is warm and dry.    No results found for this or any previous visit (from the past 72 hour(s)).  No results found.  ASSESSMENT AND PLAN:  Tinnie GensJeffrey was seen today for cough, sore throat and uri.  Diagnoses and all orders for this visit:  Viral URI with cough: Holding abx until 10 days of illness total.  -     amoxicillin (AMOXIL) 875 MG tablet; Take 1 tablet (875 mg total) by mouth 2 (two) times daily. Please allow ten days of total illness before filling. -      cetirizine-pseudoephedrine (ZYRTEC-D) 5-120 MG tablet; Take 1 tablet by mouth 2 (two) times daily.  Relationship problem between partners: He is having difficulty with his wife, however is in counseling at this time.  He will come back if he needs any help.     The patient is advised to call or return to cliviral urinic if he does not see an improvement in symptoms, or to seek the care of the closest emergency department if he worsens with the above plan.   Deliah BostonMichael Delayni Streed, MHS, PA-C [facility] 04/15/2016 7:09 PM

## 2016-04-12 NOTE — Progress Notes (Deleted)
  04/12/2016 3:29 PM   DOB: 11-16-1970 / MRN: [id number]  SUBJECTIVE:  Leonard Villegas is a 46 y.o. male presenting for   He has No Known Allergies.   He  has no past medical history on file.    He  reports that he quit smoking about 19 years ago. He has never used smokeless tobacco. He reports that he does not drink alcohol or use drugs. He  has no sexual activity history on file. The patient  has a past surgical history that includes Hernia repair and Joint replacement.  His family history includes Heart disease in his mother; Hyperlipidemia in his mother; Hypertension in his mother; Stroke in his mother.  ROS  The problem list and medications were reviewed and updated by myself where necessary and exist elsewhere in the encounter.   OBJECTIVE:  BP 102/80 (BP Location: Right Arm, Patient Position: Sitting, Cuff Size: Normal)   Pulse 80   Temp 97.9 F (36.6 C) (Oral)   Resp 18   Ht 6' (1.829 m)   Wt 277 lb (125.6 kg)   SpO2 97%   BMI 37.57 kg/m   Physical Exam  No results found for this or any previous visit (from the past 72 hour(s)).  No results found.  ASSESSMENT AND PLAN:  There are no diagnoses linked to this encounter.  The patient is advised to call or return to clinic if he does not see an improvement in symptoms, or to seek the care of the closest emergency department if he worsens with the above plan.   Deliah BostonMichael Letisha Yera, MHS, PA-C [facility] 04/12/2016 3:29 PM

## 2016-04-12 NOTE — Patient Instructions (Addendum)
Consider taking Aleve in the morning and night for the next 4-5 days for aches, pains, and mild fever.      IF you received an x-ray today, you will receive an invoice from [facility]. Please contact [facility] at [phone] with questions or concerns regarding your invoice.   IF you received labwork today, you will receive an invoice from [facility]. Please contact [facility] at [phone] with questions or concerns regarding your invoice.   Our billing staff will not be able to assist you with questions regarding bills from these companies.  You will be contacted with the lab results as soon as they are available. The fastest way to get your results is to activate your My Chart account. Instructions are located on the last page of this paperwork. If you have not heard from us regarding the results in 2 weeks, please contact this office.

## 2016-04-16 ENCOUNTER — Other Ambulatory Visit: Payer: Self-pay | Admitting: Physician Assistant

## 2016-04-16 ENCOUNTER — Telehealth: Payer: Self-pay

## 2016-04-16 DIAGNOSIS — F418 Other specified anxiety disorders: Secondary | ICD-10-CM

## 2016-04-16 MED ORDER — CLONAZEPAM 0.5 MG PO TABS: 0.5000 mg | ORAL_TABLET | Freq: Two times a day (BID) | ORAL | 1 refills | Status: DC | PRN

## 2016-04-16 MED ORDER — FLUOXETINE HCL 20 MG PO TABS: 20.0000 mg | ORAL_TABLET | Freq: Every day | ORAL | 3 refills | Status: DC

## 2016-04-16 MED ORDER — CLONAZEPAM 0.5 MG PO TABS: 0.5000 mg | ORAL_TABLET | Freq: Two times a day (BID) | ORAL | 0 refills | Status: DC | PRN

## 2016-04-16 NOTE — Telephone Encounter (Signed)
Yes he is all set. Deliah BostonMichael Clark, MS, PA-C 6:57 PM, 04/16/2016

## 2016-04-16 NOTE — Telephone Encounter (Signed)
I believe you spoke with him today?

## 2016-04-16 NOTE — Telephone Encounter (Signed)
Pt is calling says michael knows his history and pt says michael told him he would write him a prescription for an antidepressant without an ov he would like michael to give him a call and get him a prescription   Please advise  [phone]

## 2016-04-16 NOTE — Progress Notes (Signed)
Patient came to the office, as advised by me, in distress and reports he needs some help with his stress and sadness.  His wife is in the process of living him.  He is very anxious and reports he is having difficulty sleeping. He denies SI and HI at this time.  He can not afford to come in to be seen today but wants to come back in 2 weeks once he gets paid.  Does not want anything addictive today.   Diagnoses and all orders for this visit:  Depression with anxiety: Adivised bridge therapy and SSRI.  Advised he use klonopin very sparingly and to no miss doses of prozac.  -     clonazePAM (KLONOPIN) 0.5 MG tablet; Take 1 tablet (0.5 mg total) by mouth 2 (two) times daily as needed for anxiety. -     FLUoxetine (PROZAC) 20 MG tablet; Take 1 tablet (20 mg total) by mouth daily.  Other orders -     Discontinue: FLUoxetine (PROZAC) 20 MG tablet; Take 1 tablet (20 mg total) by mouth daily. -     Discontinue: clonazePAM (KLONOPIN) 0.5 MG tablet; Take 1 tablet (0.5 mg total) by mouth 2 (two) times daily as needed for anxiety.

## 2016-05-18 ENCOUNTER — Ambulatory Visit: Payer: BLUE CROSS/BLUE SHIELD

## 2016-05-27 ENCOUNTER — Ambulatory Visit (INDEPENDENT_AMBULATORY_CARE_PROVIDER_SITE_OTHER): Payer: BLUE CROSS/BLUE SHIELD | Admitting: Urgent Care

## 2016-05-27 ENCOUNTER — Encounter: Payer: Self-pay | Admitting: Urgent Care

## 2016-05-27 VITALS — BP 138/90 | HR 80 | Temp 97.6°F | Resp 18 | Ht 72.0 in | Wt 272.2 lb

## 2016-05-27 DIAGNOSIS — R51 Headache: Secondary | ICD-10-CM | POA: Diagnosis not present

## 2016-05-27 DIAGNOSIS — R52 Pain, unspecified: Secondary | ICD-10-CM | POA: Diagnosis not present

## 2016-05-27 DIAGNOSIS — R059 Cough, unspecified: Secondary | ICD-10-CM

## 2016-05-27 DIAGNOSIS — R6889 Other general symptoms and signs: Secondary | ICD-10-CM | POA: Diagnosis not present

## 2016-05-27 DIAGNOSIS — R519 Headache, unspecified: Secondary | ICD-10-CM

## 2016-05-27 DIAGNOSIS — J029 Acute pharyngitis, unspecified: Secondary | ICD-10-CM

## 2016-05-27 DIAGNOSIS — R05 Cough: Secondary | ICD-10-CM

## 2016-05-27 LAB — POCT RAPID STREP A (OFFICE): Rapid Strep A Screen: NEGATIVE

## 2016-05-27 MED ORDER — BENZONATATE 100 MG PO CAPS: 100.0000 mg | ORAL_CAPSULE | Freq: Three times a day (TID) | ORAL | 0 refills | Status: DC | PRN

## 2016-05-27 MED ORDER — OSELTAMIVIR PHOSPHATE 75 MG PO CAPS: 75.0000 mg | ORAL_CAPSULE | Freq: Two times a day (BID) | ORAL | 0 refills | Status: DC

## 2016-05-27 MED ORDER — HYDROCODONE-HOMATROPINE 5-1.5 MG/5ML PO SYRP: 5.0000 mL | ORAL_SOLUTION | Freq: Every evening | ORAL | 0 refills | Status: DC | PRN

## 2016-05-27 NOTE — Patient Instructions (Addendum)
Your strep test was negative, we will send out the culture to confirm this. In the meantime, lets manage this as the flu with Tamiflu. Take this twice daily for 5 days. You may continue to use Sudafed for congestion and post-nasal drainage. Hydrate very well and rest. Use cough medications prescribed to stop your cough.   Influenza, Adult Influenza, more commonly known as "the flu," is a viral infection that primarily affects the respiratory tract. The respiratory tract includes organs that help you breathe, such as the lungs, nose, and throat. The flu causes many common cold symptoms, as well as a high fever and body aches. The flu spreads easily from person to person (is contagious). Getting a flu shot (influenza vaccination) every year is the best way to prevent influenza. What are the causes? Influenza is caused by a virus. You can catch the virus by:  Breathing in droplets from an infected person's cough or sneeze.  Touching something that was recently contaminated with the virus and then touching your mouth, nose, or eyes. What increases the risk? The following factors may make you more likely to get the flu:  Not cleaning your hands frequently with soap and water or alcohol-based hand sanitizer.  Having close contact with many people during cold and flu season.  Touching your mouth, eyes, or nose without washing or sanitizing your hands first.  Not drinking enough fluids or not eating a healthy diet.  Not getting enough sleep or exercise.  Being under a high amount of stress.  Not getting a yearly (annual) flu shot. You may be at a higher risk of complications from the flu, such as a severe lung infection (pneumonia), if you:  Are over the age of 45.  Are pregnant.  Have a weakened disease-fighting system (immune system). You may have a weakened immune system if you:  Have HIV or AIDS.  Are undergoing chemotherapy.  Aretaking medicines that reduce the activity of  (suppress) the immune system.  Have a long-term (chronic) illness, such as heart disease, kidney disease, diabetes, or lung disease.  Have a liver disorder.  Are obese.  Have anemia. What are the signs or symptoms? Symptoms of this condition typically last 4-10 days and may include:  Fever.  Chills.  Headache, body aches, or muscle aches.  Sore throat.  Cough.  Runny or congested nose.  Chest discomfort and cough.  Poor appetite.  Weakness or tiredness (fatigue).  Dizziness.  Nausea or vomiting. How is this diagnosed? This condition may be diagnosed based on your medical history and a physical exam. Your health care provider may do a nose or throat swab test to confirm the diagnosis. How is this treated? If influenza is detected early, you can be treated with antiviral medicine that can reduce the length of your illness and the severity of your symptoms. This medicine may be given by mouth (orally) or through an IV tube that is inserted in one of your veins. The goal of treatment is to relieve symptoms by taking care of yourself at home. This may include taking over-the-counter medicines, drinking plenty of fluids, and adding humidity to the air in your home. In some cases, influenza goes away on its own. Severe influenza or complications from influenza may be treated in a hospital. Follow these instructions at home:  Take over-the-counter and prescription medicines only as told by your health care provider.  Use a cool mist humidifier to add humidity to the air in your home. This can make  breathing easier.  Rest as needed.  Drink enough fluid to keep your urine clear or pale yellow.  Cover your mouth and nose when you cough or sneeze.  Wash your hands with soap and water often, especially after you cough or sneeze. If soap and water are not available, use hand sanitizer.  Stay home from work or school as told by your health care provider. Unless you are visiting  your health care provider, try to avoid leaving home until your fever has been gone for 24 hours without the use of medicine.  Keep all follow-up visits as told by your health care provider. This is important. How is this prevented?  Getting an annual flu shot is the best way to avoid getting the flu. You may get the flu shot in late summer, fall, or winter. Ask your health care provider when you should get your flu shot.  Wash your hands often or use hand sanitizer often.  Avoid contact with people who are sick during cold and flu season.  Eat a healthy diet, drink plenty of fluids, get enough sleep, and exercise regularly. Contact a health care provider if:  You develop new symptoms.  You have:  Chest pain.  Diarrhea.  A fever.  Your cough gets worse.  You produce more mucus.  You feel nauseous or you vomit. Get help right away if:  You develop shortness of breath or difficulty breathing.  Your skin or nails turn a bluish color.  You have severe pain or stiffness in your neck.  You develop a sudden headache or sudden pain in your face or ear.  You cannot stop vomiting. This information is not intended to replace advice given to you by your health care provider. Make sure you discuss any questions you have with your health care provider. Document Released: 03/22/2000 Document Revised: 08/31/2015 Document Reviewed: 01/17/2015 Elsevier Interactive Patient Education  2017 [employer].     IF you received an x-ray today, you will receive an invoice from [facility]. Please contact [facility] at [phone] with questions or concerns regarding your invoice.   IF you received labwork today, you will receive an invoice from [facility]. Please contact [facility] at [phone] with questions or concerns regarding your invoice.   Our billing staff will not be able to assist you with questions regarding bills from these companies.  You will be contacted  with the lab results as soon as they are available. The fastest way to get your results is to activate your My Chart account. Instructions are located on the last page of this paperwork. If you have not heard from [place] regarding the results in 2 weeks, please contact this office.

## 2016-05-27 NOTE — Progress Notes (Signed)
  MRN: [id number] DOB: 08/23/1970  Subjective:   Leonard Villegas is a 46 y.o. male presenting for chief complaint of Sore Throat (X2 days); Generalized Body Aches; and Cough (producing sputum but does not know the color)  Reports 2 day history of worsening sore throat, now having body aches, headaches, mild nasal congestion, productive cough, nausea without vomiting. Cough has elicited mild shob. Has tried Sudafed, Mucinex. Denies fever, eye pain, ear pain, sinus pain, chest pain, wheezing, abdominal pain, rashes. 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 Did not take his flu shot this season. Has rare alcohol drink. Denies smoking cigarettes.   Leonard GensJeffrey has a current medication list which includes the following prescription(s): fluoxetine. Also has No Known Allergies.  Leonard GensJeffrey has past medical history of mood disorder. Also  has a past surgical history that includes Hernia repair and Joint replacement.  Objective:   Vitals: BP 138/90   Pulse 80   Temp 97.6 F (36.4 C) (Oral)   Resp 18   Ht 6' (1.829 m)   Wt 272 lb 3.2 oz (123.5 kg)   SpO2 96%   BMI 36.92 kg/m   Physical Exam  Constitutional: He is oriented to person, place, and time. He appears well-developed and well-nourished.  HENT:  TM's intact bilaterally, no effusions or erythema. Nasal turbinates pink and moist, nasal passages patent. No sinus tenderness. Oropharynx clear, mucous membranes moist, dentition in good repair.  Eyes: Right eye exhibits no discharge. Left eye exhibits no discharge.  Neck: Normal range of motion. Neck supple.  Cardiovascular: Normal rate, regular rhythm and intact distal pulses.  Exam reveals no gallop and no friction rub.   No murmur heard. Pulmonary/Chest: No respiratory distress. He has no wheezes. He has no rales.  Lymphadenopathy:    He has cervical adenopathy (mild, right sided).  Neurological: He is alert and oriented to person, place, and time.  Skin: Skin is warm and dry.   Results  for orders placed or performed in visit on 05/27/16 (from the past 24 hour(s))  POCT rapid strep A     Status: None   Collection Time: 05/27/16  3:45 PM  Result Value Ref Range   Rapid Strep A Screen Negative Negative   Assessment and Plan :   1. Flu-like symptoms 2. Sore throat 3. Cough 4. Body aches 5. Generalized headaches - Strep culture is pending. Will manage as flu given his exposure. Supportive care advised.  Wallis BambergMario Reena Borromeo, PA-C [facility] [phone] 05/27/2016  3:28 PM

## 2016-05-30 ENCOUNTER — Other Ambulatory Visit: Payer: Self-pay | Admitting: Urgent Care

## 2016-05-30 LAB — CULTURE, GROUP A STREP: Strep A Culture: POSITIVE — AB

## 2016-05-30 MED ORDER — AMOXICILLIN 500 MG PO CAPS: 500.0000 mg | ORAL_CAPSULE | Freq: Two times a day (BID) | ORAL | 0 refills | Status: DC

## 2016-06-03 ENCOUNTER — Other Ambulatory Visit: Payer: Self-pay | Admitting: Physician Assistant

## 2016-06-03 ENCOUNTER — Telehealth: Payer: Self-pay | Admitting: Physician Assistant

## 2016-06-03 MED ORDER — AMOXICILLIN 500 MG PO CAPS: 500.0000 mg | ORAL_CAPSULE | Freq: Two times a day (BID) | ORAL | 0 refills | Status: DC

## 2016-06-03 NOTE — Telephone Encounter (Signed)
Patient calling after hours line at 9:30 reporting he left [place] antibiotics in [place] requesting a refill to be picked up tomorrow. Will call send in the morning. Deliah BostonMichael Lateria Alderman, MS, PA-C 9:30 pm, 06/02/2016

## 2016-11-30 IMAGING — DX DG FOOT COMPLETE 3+V*L*
3 series · 3 of 3 positions shown · non-contrast
Comparison: None.

CLINICAL DATA: Lateral left foot pain in the region of the fifth
metatarsal for the past 3 weeks. No known injury.

EXAM:
LEFT FOOT - COMPLETE 3+ VIEW

[foot ap]
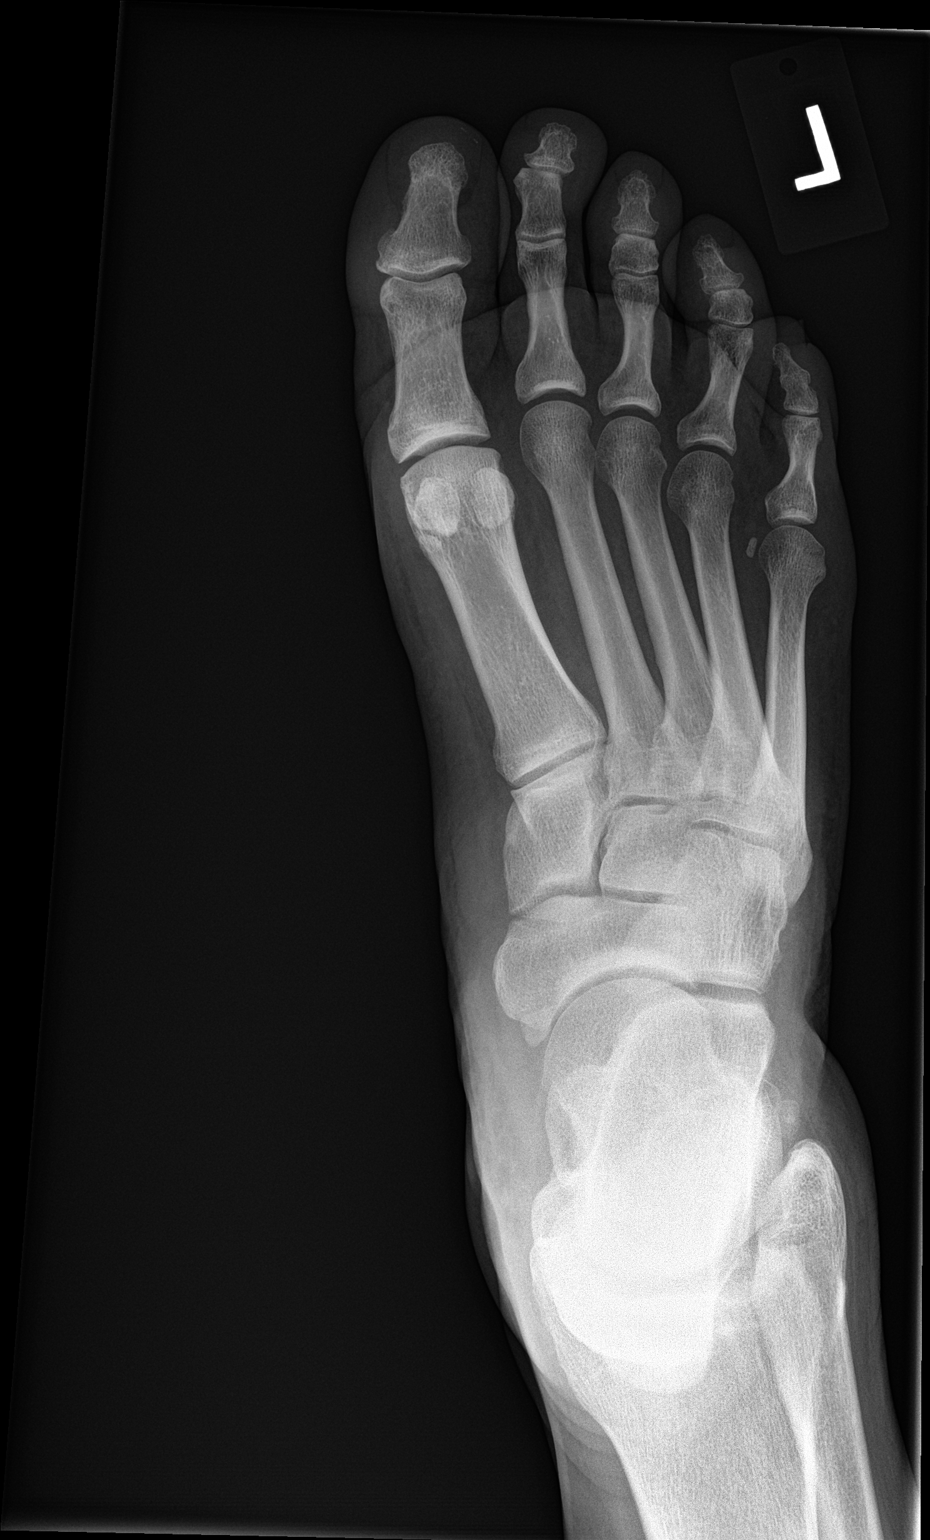

[foot obl]
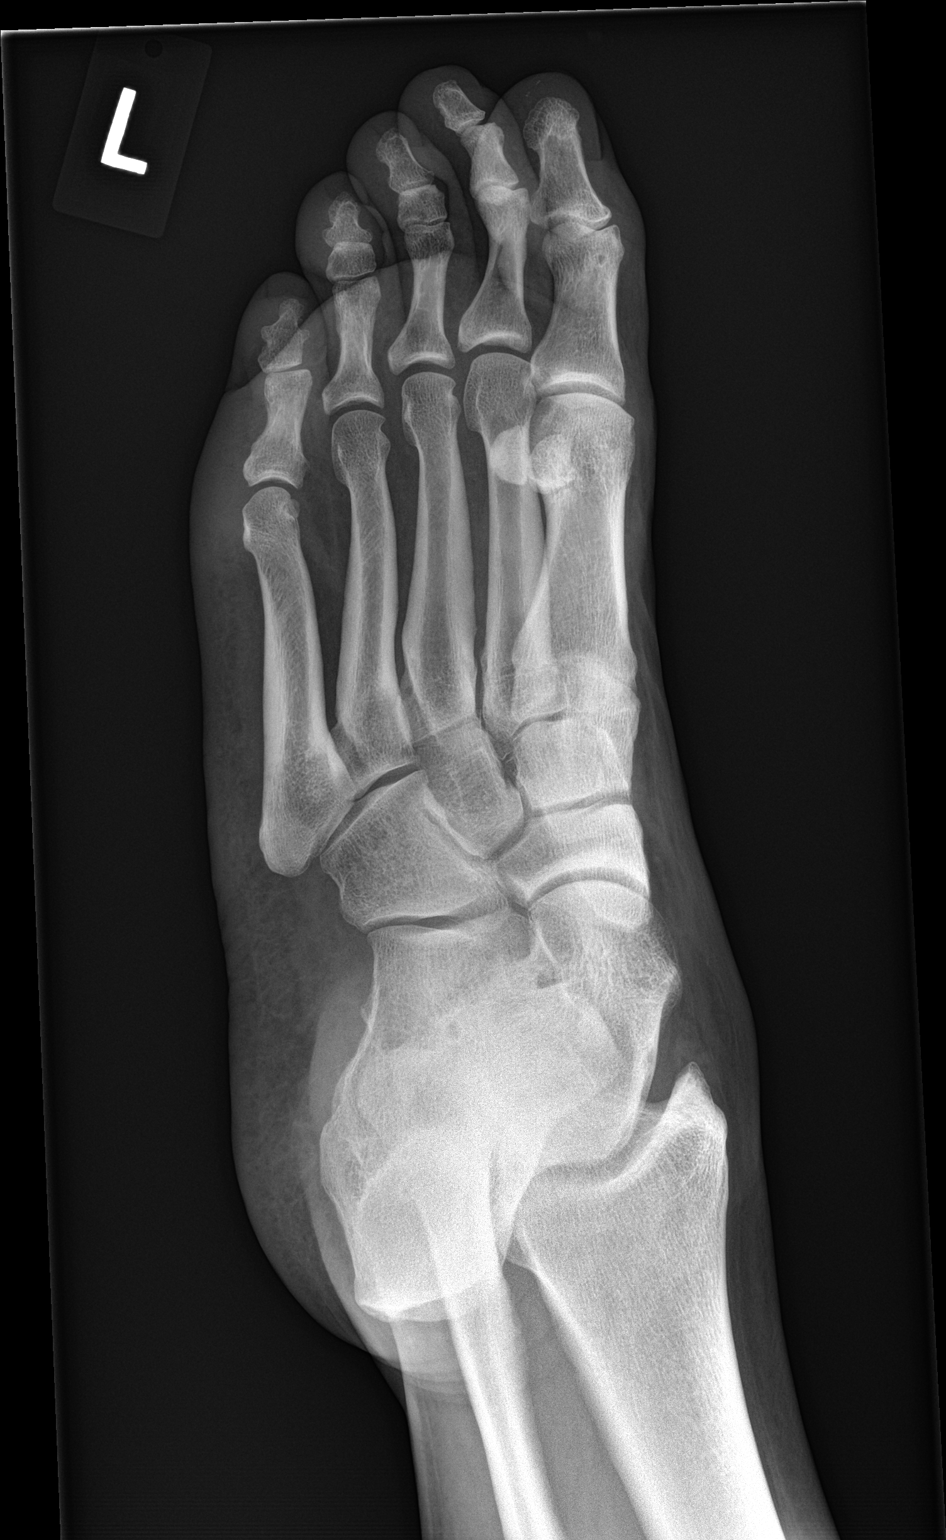

[foot lat]
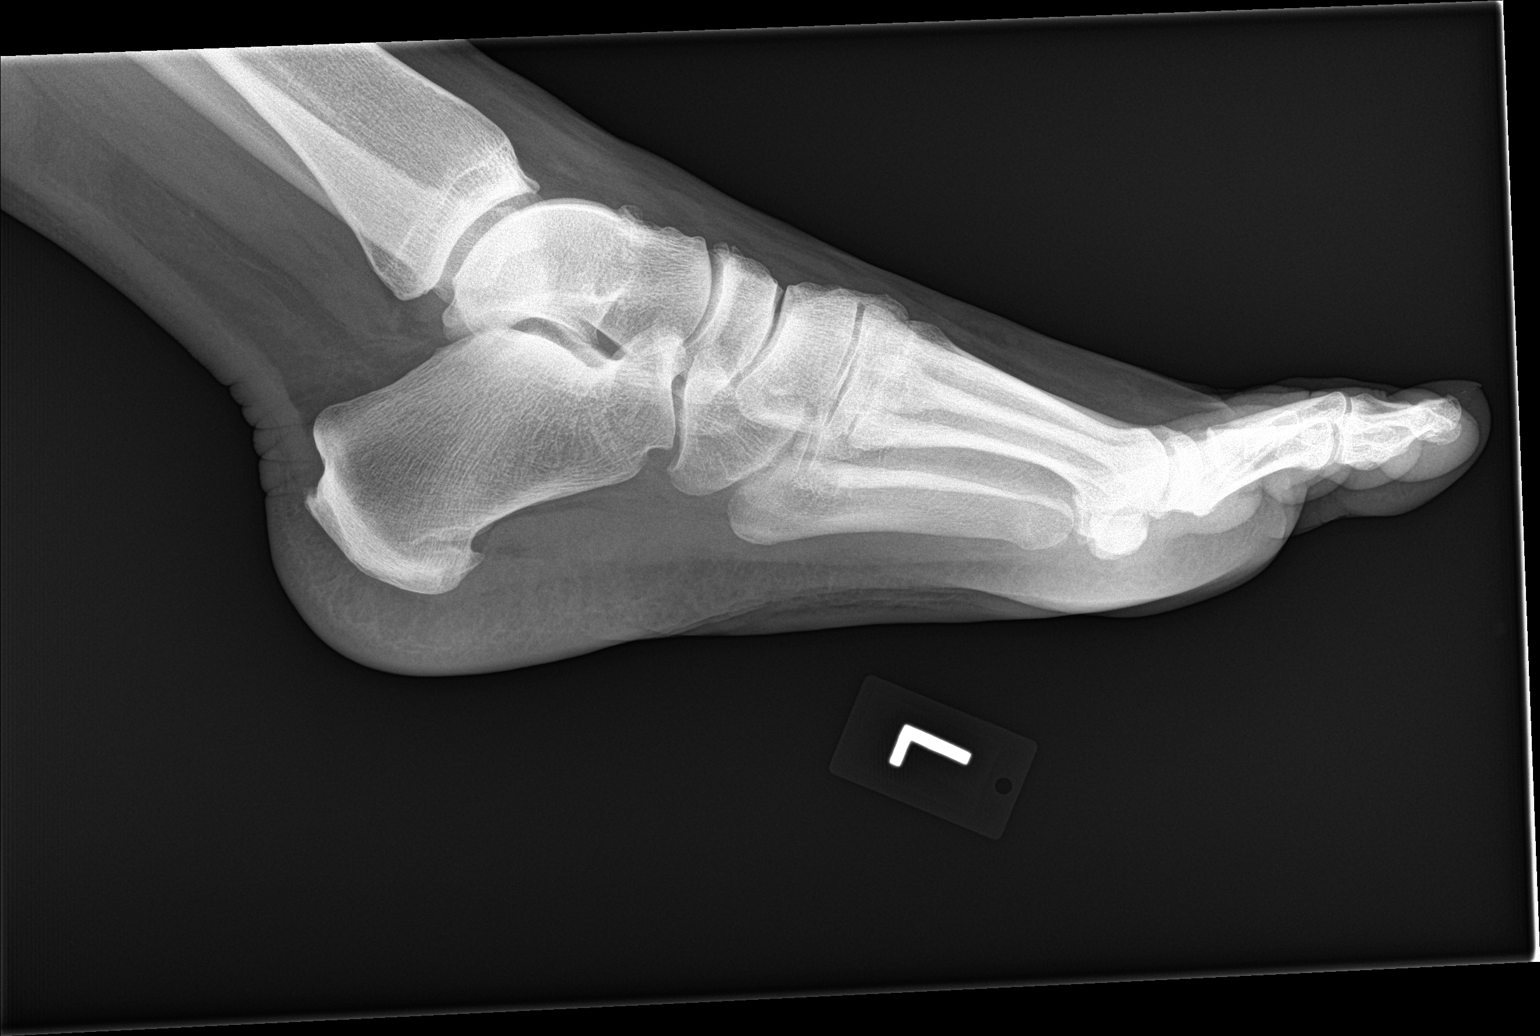

[3 of 3 positions shown; findings below may reference images not displayed]

FINDINGS: Mild inferior posterior calcaneal spur formation. Mild dorsal tarsal
spur formation and hyperostosis. Otherwise, normal appearing bones
and soft tissues.
IMPRESSION: No acute abnormality. Mild calcaneal spur formation and mild dorsal
tarsal spur formation and hyperostosis.

## 2017-03-22 ENCOUNTER — Other Ambulatory Visit: Payer: Self-pay | Admitting: Physician Assistant

## 2017-09-24 ENCOUNTER — Telehealth: Payer: Self-pay | Admitting: Physician Assistant

## 2017-09-24 NOTE — Telephone Encounter (Signed)
Called pt to let him know that his medical records were ready per his request. Advised him to come to [address] and they will be at the front desk.

## 2019-11-09 DIAGNOSIS — F418 Other specified anxiety disorders: Secondary | ICD-10-CM | POA: Insufficient documentation

## 2019-11-09 DIAGNOSIS — E66813 Body mass index (BMI) 40.0-44.9, adult: Secondary | ICD-10-CM | POA: Insufficient documentation

## 2019-11-09 DIAGNOSIS — F900 Attention-deficit hyperactivity disorder, predominantly inattentive type: Secondary | ICD-10-CM | POA: Insufficient documentation

## 2019-11-09 DIAGNOSIS — I10 Essential (primary) hypertension: Secondary | ICD-10-CM | POA: Insufficient documentation

## 2019-11-09 DIAGNOSIS — F419 Anxiety disorder, unspecified: Secondary | ICD-10-CM | POA: Insufficient documentation

## 2019-11-09 DIAGNOSIS — Z6841 Body Mass Index (BMI) 40.0 and over, adult: Secondary | ICD-10-CM | POA: Insufficient documentation

## 2020-01-19 ENCOUNTER — Other Ambulatory Visit: Payer: Self-pay | Admitting: Family Medicine

## 2020-01-19 DIAGNOSIS — M25571 Pain in right ankle and joints of right foot: Secondary | ICD-10-CM

## 2020-02-09 ENCOUNTER — Other Ambulatory Visit: Payer: Self-pay

## 2020-02-09 ENCOUNTER — Ambulatory Visit
Admission: RE | Admit: 2020-02-09 | Discharge: 2020-02-09 | Disposition: A | Discharge status: Home / Self Care | Payer: BC Managed Care – PPO | Source: Ambulatory Visit | Attending: Family Medicine | Admitting: Family Medicine

## 2020-02-09 DIAGNOSIS — M25571 Pain in right ankle and joints of right foot: Secondary | ICD-10-CM

## 2020-05-04 DIAGNOSIS — L219 Seborrheic dermatitis, unspecified: Secondary | ICD-10-CM | POA: Insufficient documentation

## 2020-05-29 DIAGNOSIS — L409 Psoriasis, unspecified: Secondary | ICD-10-CM | POA: Insufficient documentation

## 2020-05-29 DIAGNOSIS — L4050 Arthropathic psoriasis, unspecified: Secondary | ICD-10-CM | POA: Insufficient documentation

## 2021-01-10 IMAGING — MR MR ANKLE*R* W/O CM
4 of 5 series · 13 of 40 positions shown · non-contrast
Comparison: None.

CLINICAL DATA: Right ankle pain.

EXAM:
MRI OF THE RIGHT ANKLE WITHOUT CONTRAST
TECHNIQUE: Multiplanar, multisequence MR imaging of the ankle was performed. No
intravenous contrast was administered.

[Series 3: PD fat-sat · axial · right · 3.0mm · 0.31mm/px · z∈[-84,+48]mm · 4 of 39 slices shown]
[im 1/39]
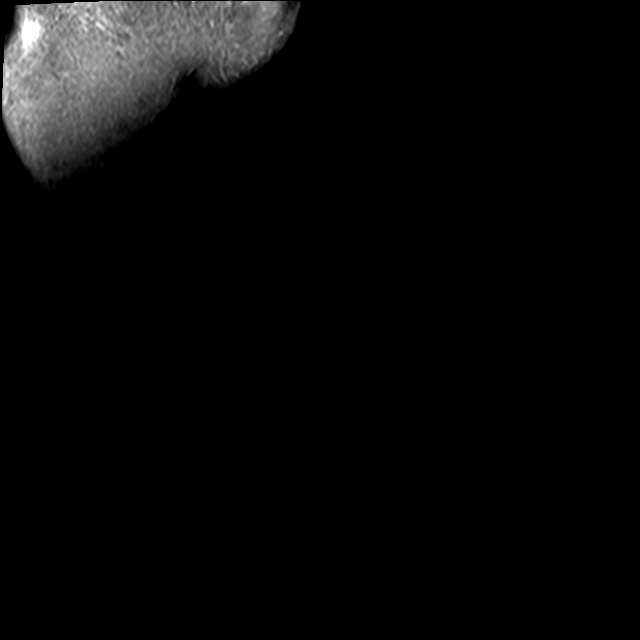
[im 5/39]
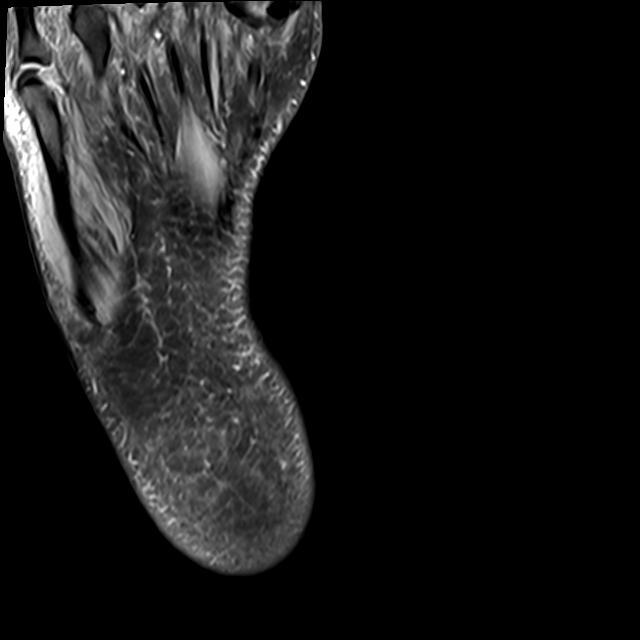
[im 20/39]
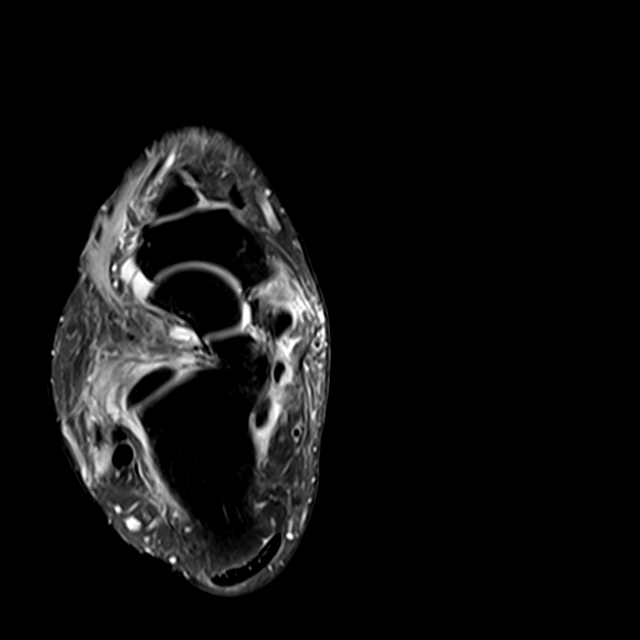
[im 34/39]
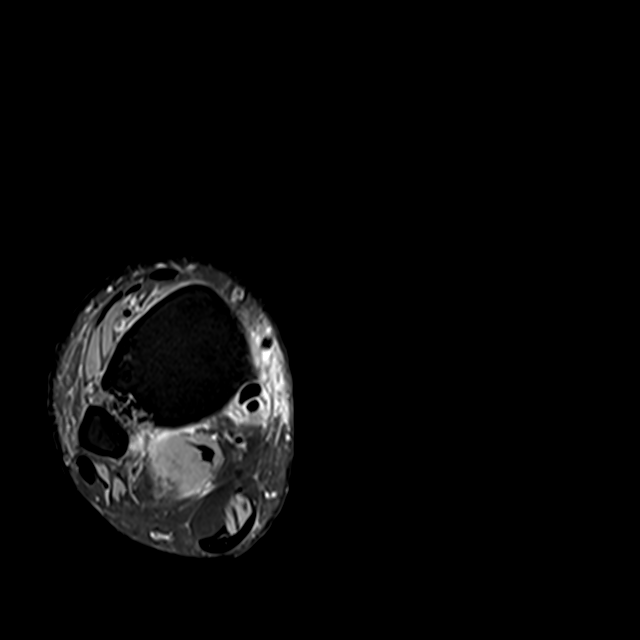

[Series 4: T2 fat-sat · axial · right · 3.0mm · 0.31mm/px · z∈[-75,+40]mm · 3 of 39 slices shown (1 of 2)]
[im 5/39]
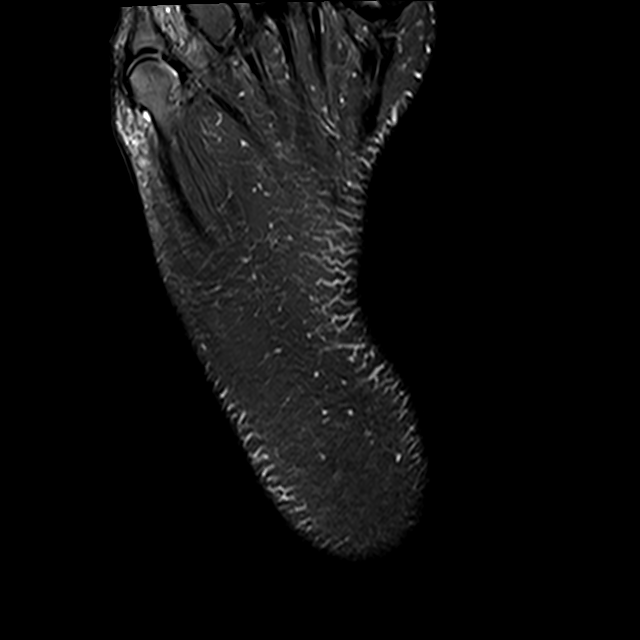
[im 20/39]
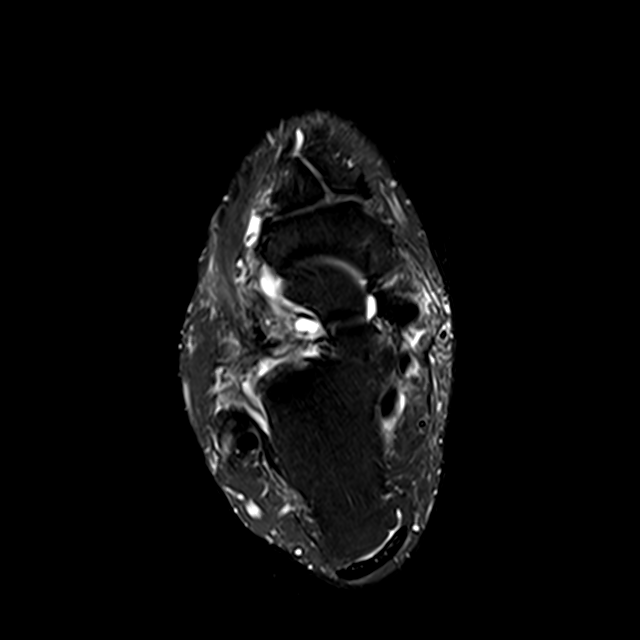
[im 34/39]
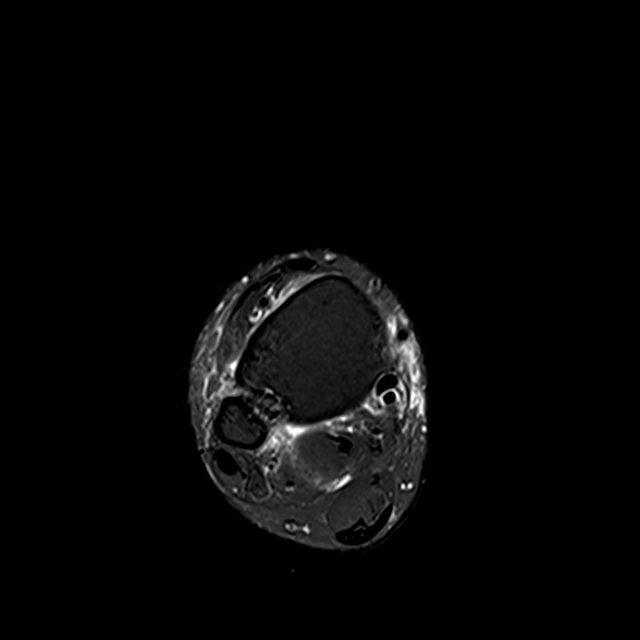

[Series 5: T1 · sagittal · right · 4.0mm · 0.31mm/px · 3 of 24 slices shown]
[im 5/24]
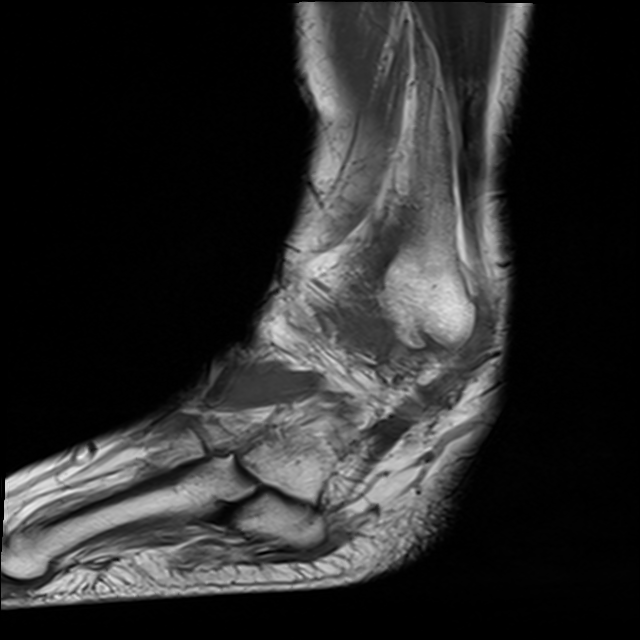
[im 14/24]
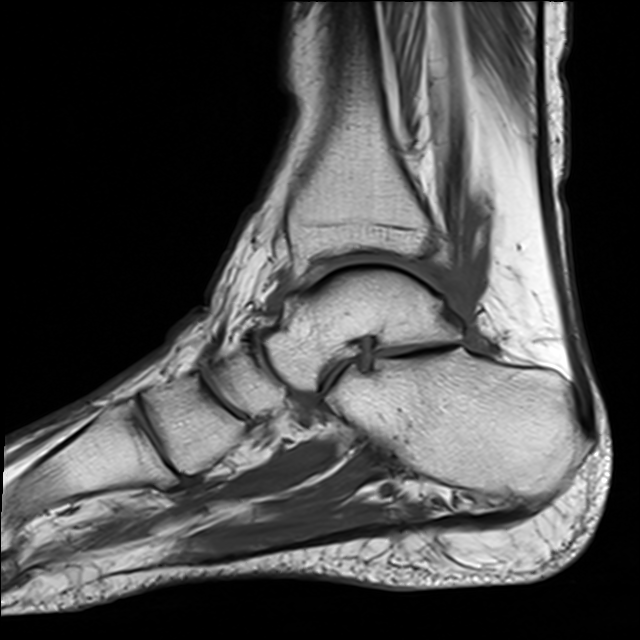
[im 24/24]
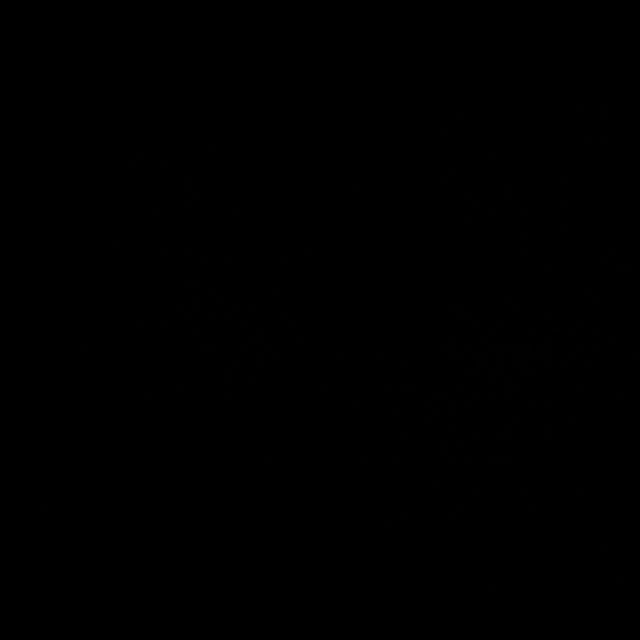

[Series 7: T2 fat-sat · coronal · right · 3.0mm · 0.25mm/px · 3 of 46 slices shown (2 of 2)]
[im 5/46]
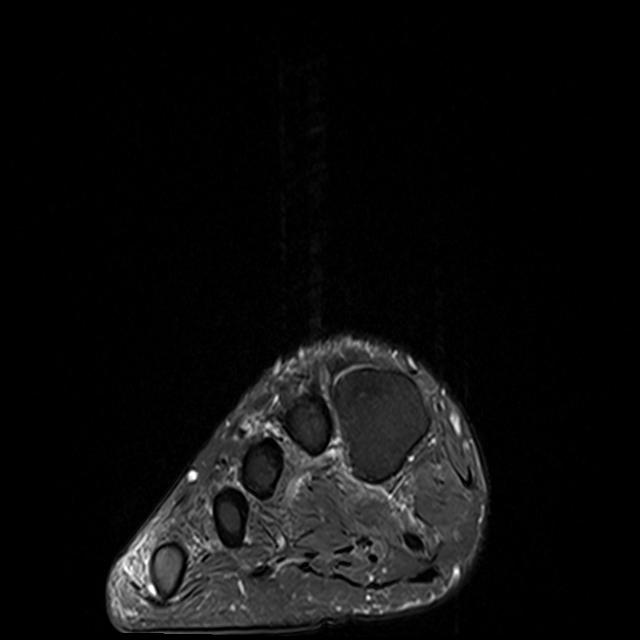
[im 23/46]
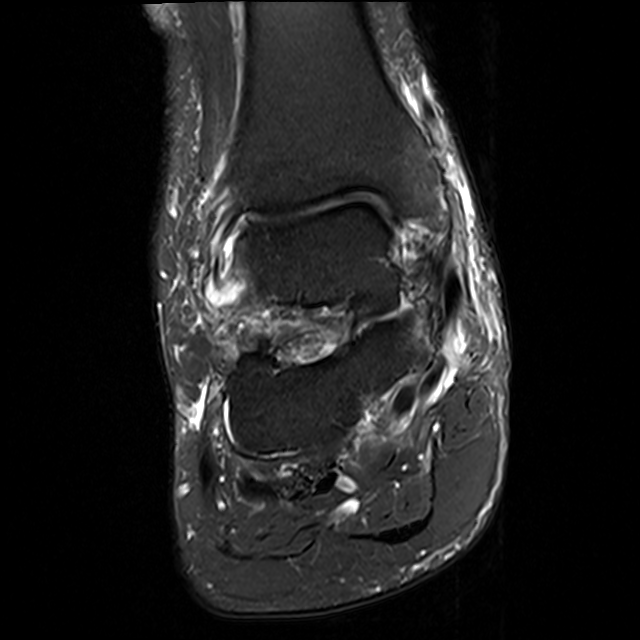
[im 41/46]
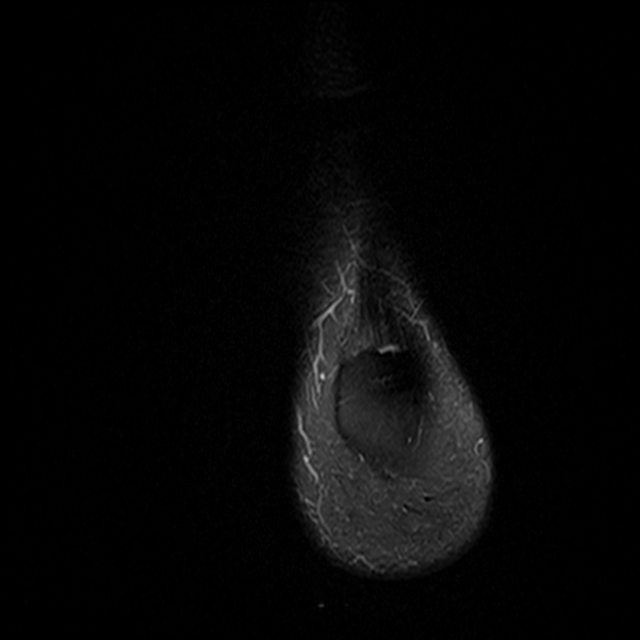

[13 of 40 positions shown; findings below may reference images not displayed]

FINDINGS: TENDONS

Peroneal: Peroneal longus tendon intact. Moderate tendinosis of the
peroneus brevis with a small partial-thickness tear just beyond the
lateral malleolus.

Posteromedial: Posterior tibial tendon intact. Flexor hallucis
longus tendon intact. Flexor digitorum longus tendon intact.

Anterior: Tibialis anterior tendon intact. Extensor hallucis longus
tendon intact Extensor digitorum longus tendon intact.

Achilles:  Intact.

Plantar Fascia: Intact.

LIGAMENTS

Lateral: Complete tear of the anterior talofibular ligament.
Calcaneofibular ligament intact. Posterior talofibular ligament
intact. Anterior and posterior tibiofibular ligaments intact.

Medial: Partial-thickness tear of the deltoid ligament with an
avulsion fracture of the medial malleolus. Spring ligament intact.

CARTILAGE

Ankle Joint: Small joint effusion with synovitis. Partial-thickness
cartilage loss of the tibiotalar joint.

Subtalar Joints/Sinus Tarsi: Normal subtalar joints. No subtalar
joint effusion. Normal sinus tarsi.

Bones: Avulsion fracture of the distal tip of the medial malleolus
with marrow edema within the avulsed fragment. Mild osteoarthritis
of the talonavicular joint.

Soft Tissue: No fluid collection or hematoma. Muscles are normal
without edema or atrophy. Tarsal tunnel is normal.
IMPRESSION: 1. Avulsion fracture of the distal tip of the medial malleolus with
marrow edema within the avulsed fragment.
2. Complete tear of the anterior talofibular ligament.
3. Partial-thickness tear of the deltoid ligament with an avulsion
fracture of the medial malleolus.
4. Moderate tendinosis of the peroneus brevis with a small
partial-thickness tear just beyond the lateral malleolus.

## 2022-02-26 DIAGNOSIS — Z9889 Other specified postprocedural states: Secondary | ICD-10-CM | POA: Insufficient documentation

## 2022-02-26 DIAGNOSIS — H7411 Adhesive right middle ear disease: Secondary | ICD-10-CM | POA: Insufficient documentation

## 2022-02-26 DIAGNOSIS — H6993 Unspecified Eustachian tube disorder, bilateral: Secondary | ICD-10-CM | POA: Insufficient documentation

## 2022-02-26 DIAGNOSIS — H900 Conductive hearing loss, bilateral: Secondary | ICD-10-CM | POA: Insufficient documentation

## 2022-09-19 ENCOUNTER — Ambulatory Visit
Admission: RE | Admit: 2022-09-19 | Discharge: 2022-09-19 | Disposition: A | Discharge status: Home / Self Care | Payer: BC Managed Care – PPO | Source: Ambulatory Visit | Attending: Internal Medicine | Admitting: Internal Medicine

## 2022-09-19 ENCOUNTER — Other Ambulatory Visit: Payer: Self-pay | Admitting: Internal Medicine

## 2022-09-19 DIAGNOSIS — R0602 Shortness of breath: Secondary | ICD-10-CM

## 2022-10-08 ENCOUNTER — Other Ambulatory Visit (HOSPITAL_BASED_OUTPATIENT_CLINIC_OR_DEPARTMENT_OTHER): Payer: Self-pay | Admitting: Internal Medicine

## 2022-10-08 DIAGNOSIS — I10 Essential (primary) hypertension: Secondary | ICD-10-CM

## 2022-11-18 ENCOUNTER — Encounter: Payer: Self-pay | Admitting: Family Medicine

## 2022-11-18 ENCOUNTER — Other Ambulatory Visit: Payer: Self-pay | Admitting: Family Medicine

## 2022-11-18 DIAGNOSIS — R202 Paresthesia of skin: Secondary | ICD-10-CM

## 2022-11-20 ENCOUNTER — Ambulatory Visit (HOSPITAL_BASED_OUTPATIENT_CLINIC_OR_DEPARTMENT_OTHER)
Admission: RE | Admit: 2022-11-20 | Discharge: 2022-11-20 | Disposition: A | Discharge status: Home / Self Care | Payer: BC Managed Care – PPO | Source: Ambulatory Visit | Attending: Internal Medicine | Admitting: Internal Medicine

## 2022-11-20 DIAGNOSIS — I10 Essential (primary) hypertension: Secondary | ICD-10-CM | POA: Insufficient documentation

## 2022-11-21 ENCOUNTER — Ambulatory Visit
Admission: RE | Admit: 2022-11-21 | Discharge: 2022-11-21 | Disposition: A | Discharge status: Home / Self Care | Payer: BC Managed Care – PPO | Source: Ambulatory Visit | Attending: Family Medicine | Admitting: Family Medicine

## 2022-11-21 DIAGNOSIS — R202 Paresthesia of skin: Secondary | ICD-10-CM

## 2022-11-28 DIAGNOSIS — F331 Major depressive disorder, recurrent, moderate: Secondary | ICD-10-CM | POA: Insufficient documentation

## 2023-01-16 ENCOUNTER — Ambulatory Visit (INDEPENDENT_AMBULATORY_CARE_PROVIDER_SITE_OTHER): Payer: BC Managed Care – PPO | Admitting: Otolaryngology

## 2023-01-28 ENCOUNTER — Ambulatory Visit (INDEPENDENT_AMBULATORY_CARE_PROVIDER_SITE_OTHER): Payer: BC Managed Care – PPO

## 2023-02-25 ENCOUNTER — Telehealth: Payer: Self-pay | Admitting: Neurology

## 2023-02-25 ENCOUNTER — Ambulatory Visit: Payer: BC Managed Care – PPO | Admitting: Neurology

## 2023-02-25 NOTE — Telephone Encounter (Signed)
Pt canceled appointment due to both ear drums rupturing and had a doctors a appointment. I advised him of the $50 no show fee. Transferred to billing.

## 2023-03-03 ENCOUNTER — Telehealth (INDEPENDENT_AMBULATORY_CARE_PROVIDER_SITE_OTHER): Payer: Self-pay | Admitting: Otolaryngology

## 2023-03-03 NOTE — Telephone Encounter (Signed)
Patient called regarding clogged ears, making him nauseous as well. 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 Please give patient a call.

## 2023-04-17 ENCOUNTER — Other Ambulatory Visit (HOSPITAL_BASED_OUTPATIENT_CLINIC_OR_DEPARTMENT_OTHER): Payer: Self-pay

## 2023-04-17 MED ORDER — OZEMPIC (0.25 OR 0.5 MG/DOSE) 2 MG/3ML ~~LOC~~ SOPN
0.5000 mg | PEN_INJECTOR | SUBCUTANEOUS | 0 refills | Status: AC
  Filled 2023-04-17 – 2023-04-23 (×4): qty 3, 28d supply, fill #0

## 2023-04-17 MED ORDER — PANTOPRAZOLE SODIUM 40 MG PO TBEC
40.0000 mg | DELAYED_RELEASE_TABLET | ORAL | 0 refills | Status: DC
  Filled 2023-04-17 (×2): qty 90, 90d supply, fill #0

## 2023-04-23 ENCOUNTER — Other Ambulatory Visit (HOSPITAL_BASED_OUTPATIENT_CLINIC_OR_DEPARTMENT_OTHER): Payer: Self-pay

## 2023-05-15 ENCOUNTER — Ambulatory Visit: Payer: 59 | Admitting: Neurology

## 2023-05-15 ENCOUNTER — Encounter: Payer: Self-pay | Admitting: Neurology

## 2023-05-15 VITALS — BP 123/84 | HR 86 | Ht 74.0 in | Wt 358.0 lb

## 2023-05-15 DIAGNOSIS — R202 Paresthesia of skin: Secondary | ICD-10-CM | POA: Diagnosis not present

## 2023-05-15 DIAGNOSIS — M542 Cervicalgia: Secondary | ICD-10-CM

## 2023-05-15 DIAGNOSIS — M5442 Lumbago with sciatica, left side: Secondary | ICD-10-CM

## 2023-05-15 NOTE — Progress Notes (Signed)
 Chief Complaint  Patient presents with   New Patient (Initial Visit)    Pt in 14, here alone Pt is referred for neck pain and Left arm paresthesia and headaches. Pt states when standing for long periods of time his left leg gets numb. Pt states that his left arm goes numb on and off.       ASSESSMENT AND PLAN  Leonard Villegas is a 53 y.o. male   Left cervical pain radiating pain to left shoulder blade, left lateral arm,  MRI of cervical spine to rule out cervical radiculopathy Worsening low back pain, radiating pain to left lower extremity  MRI of lumbar spine for evaluation of lumbar radiculopathy  His obesity certainly contributed to above complaints, emphasized importance of moderate exercise, stretching  Follow-up plan will depend on MRI findings  DIAGNOSTIC DATA (LABS, IMAGING, TESTING) - I reviewed patient records, labs, notes, testing and imaging myself where available.   MEDICAL HISTORY:  Leonard Villegas is a 53 year old right-handed male, seen in request by his primary care from [facility] Dr. Anastasio, [place], for evaluation of left shoulder pain, paresthesia of left arm, left low back pain, initial evaluation May 15, 2023    History is obtained from the patient and review of electronic medical records. I personally reviewed pertinent available imaging films in PACS.   PMHx of  HTN DM Depression, anxiety  He is an tourist information centre manager, work on his PhD, he has chronic low back pain, history of obesity, getting worse over the past 6 months, tends to stay on the left side, after standing for 30 minutes, he would fell deep achy numbness of left lower extremity, become very uncomfortable, but denied persistent sensory or motor deficit, ambulates short distance without much difficulty, has nocturnal urinary frequency, but no incontinence  In addition, over past 6 months, he also noticed worsening left neck pain, radiating pain to left shoulder blade, left lateral arm      PHYSICAL EXAM:   Vitals:   05/15/23 1541  BP: 123/84  Pulse: 86  Weight: (!) 358 lb (162.4 kg)  Height: 6' 2 (1.88 m)   Body mass index is 45.96 kg/m.  PHYSICAL EXAMNIATION:  Gen: NAD, conversant, well nourised, well groomed                     Cardiovascular: Regular rate rhythm, no peripheral edema, warm, nontender. Eyes: Conjunctivae clear without exudates or hemorrhage Neck: Supple, no carotid bruits. Pulmonary: Clear to auscultation bilaterally   NEUROLOGICAL EXAM:  MENTAL STATUS: Speech/cognition: Awake, alert, oriented to history taking and casual conversation, obesity CRANIAL NERVES: CN II: Visual fields are full to confrontation. Pupils are round equal and briskly reactive to light. CN III, IV, VI: extraocular movement are normal. No ptosis. CN V: Facial sensation is intact to light touch CN VII: Face is symmetric with normal eye closure  CN VIII: Hearing is normal to causal conversation. CN IX, X: Phonation is normal. CN XI: Head turning and shoulder shrug are intact  MOTOR: There is no pronator drift of out-stretched arms. Muscle bulk and tone are normal. Muscle strength is normal.  REFLEXES: Reflexes are 1 and symmetric at the biceps, triceps, knees, and ankles. Plantar responses are flexor.  SENSORY: Intact to light touch, pinprick and vibratory sensation are intact in fingers and toes.  COORDINATION: There is no trunk or limb dysmetria noted.  GAIT/STANCE: Pushing up from seated position, limited by his big body habitus, able to bear weight with  tiptoe or heels  REVIEW OF SYSTEMS:  Full 14 system review of systems performed and notable only for as above All other review of systems were negative.   ALLERGIES: No Known Allergies  HOME MEDICATIONS: Current Outpatient Medications  Medication Sig Dispense Refill   buPROPion (WELLBUTRIN SR) 150 MG 12 hr tablet Take 150 mg by mouth 2 (two) times daily.     busPIRone (BUSPAR) 10 MG tablet Take 10  mg by mouth 3 (three) times daily.     carvedilol (COREG) 12.5 MG tablet Take by mouth.     naproxen  sodium (ANAPROX ) 550 MG tablet Take 550 mg by mouth 2 (two) times daily with a meal.     olmesartan-hydrochlorothiazide (BENICAR HCT) 40-12.5 MG tablet Take 1 tablet by mouth daily.     Secukinumab (COSENTYX, 300 MG DOSE, [place]) Inject into the skin. Takes every 4 weeks     Semaglutide ,0.25 or 0.5MG /DOS, (OZEMPIC , 0.25 OR 0.5 MG/DOSE,) 2 MG/3ML SOPN Inject 0.5 mg into the skin every 7 (seven) days. 3 mL 0   sertraline (ZOLOFT) 50 MG tablet Take 50 mg by mouth daily.     No current facility-administered medications for this visit.    PAST MEDICAL HISTORY: Past Medical History:  Diagnosis Date   Headache    Hypertension     PAST SURGICAL HISTORY: Past Surgical History:  Procedure Laterality Date   HERNIA REPAIR     JOINT REPLACEMENT      FAMILY HISTORY: Family History  Problem Relation Age of Onset   Heart disease Mother    Hyperlipidemia Mother    Hypertension Mother    Stroke Mother    Migraines Father     SOCIAL HISTORY: Social History   Socioeconomic History   Marital status: Married    Spouse name: Not on file   Number of children: Not on file   Years of education: Not on file   Highest education level: Not on file  Occupational History   Not on file  Tobacco Use   Smoking status: Former    Current packs/day: 0.00    Types: Cigarettes    Quit date: 06/09/1996    Years since quitting: 26.9   Smokeless tobacco: Never  Vaping Use   Vaping status: Never Used  Substance and Sexual Activity   Alcohol use: No    Alcohol/week: 0.0 standard drinks of alcohol   Drug use: No   Sexual activity: Not on file  Other Topics Concern   Not on file  Social History Narrative   Not on file   Social Drivers of Health   Financial Resource Strain: Not on file  Food Insecurity: Not on file  Transportation Needs: Not on file  Physical Activity: Not on file  Stress: Not on  file  Social Connections: Not on file  Intimate Partner Violence: Not on file      Leonard Villegas, M.D. Ph.D.  [facility] [address], [address] [place], [place] [address] Ph: [phone] Fax: [phone]  CC:  Anastasio Duncans, DO [address] [place],  [place] [address]  Patient, No Pcp Per

## 2023-05-20 ENCOUNTER — Telehealth: Payer: Self-pay | Admitting: Neurology

## 2023-05-20 NOTE — Telephone Encounter (Signed)
[employer] state no auth required sent to GI due to weight. [phone]

## 2023-05-22 ENCOUNTER — Other Ambulatory Visit (HOSPITAL_BASED_OUTPATIENT_CLINIC_OR_DEPARTMENT_OTHER): Payer: Self-pay

## 2023-05-22 MED ORDER — OZEMPIC (1 MG/DOSE) 4 MG/3ML ~~LOC~~ SOPN
1.0000 mg | PEN_INJECTOR | SUBCUTANEOUS | 0 refills | Status: AC
Start: 2023-05-22 — End: ?
  Filled 2023-05-22 – 2023-07-20 (×2): qty 3, 28d supply, fill #0

## 2023-06-16 ENCOUNTER — Other Ambulatory Visit (HOSPITAL_BASED_OUTPATIENT_CLINIC_OR_DEPARTMENT_OTHER): Payer: Self-pay

## 2023-06-16 MED ORDER — OZEMPIC (1 MG/DOSE) 4 MG/3ML ~~LOC~~ SOPN
1.0000 mg | PEN_INJECTOR | SUBCUTANEOUS | 0 refills | Status: AC
  Filled 2023-06-16: qty 3, 28d supply, fill #0

## 2023-06-17 ENCOUNTER — Other Ambulatory Visit (HOSPITAL_BASED_OUTPATIENT_CLINIC_OR_DEPARTMENT_OTHER): Payer: Self-pay

## 2023-07-01 ENCOUNTER — Other Ambulatory Visit: Payer: Self-pay

## 2023-07-08 ENCOUNTER — Other Ambulatory Visit (HOSPITAL_BASED_OUTPATIENT_CLINIC_OR_DEPARTMENT_OTHER): Payer: Self-pay

## 2023-07-08 MED ORDER — OZEMPIC (1 MG/DOSE) 4 MG/3ML ~~LOC~~ SOPN
1.0000 mg | PEN_INJECTOR | SUBCUTANEOUS | 0 refills | Status: AC
Start: 2023-07-08 — End: ?
  Filled 2023-07-08 – 2023-07-20 (×2): qty 3, 28d supply, fill #0

## 2023-07-21 ENCOUNTER — Other Ambulatory Visit (HOSPITAL_BASED_OUTPATIENT_CLINIC_OR_DEPARTMENT_OTHER): Payer: Self-pay

## 2023-07-21 ENCOUNTER — Other Ambulatory Visit: Payer: Self-pay

## 2023-07-22 ENCOUNTER — Other Ambulatory Visit (HOSPITAL_BASED_OUTPATIENT_CLINIC_OR_DEPARTMENT_OTHER): Payer: Self-pay

## 2023-07-22 MED ORDER — OZEMPIC (1 MG/DOSE) 4 MG/3ML ~~LOC~~ SOPN
1.0000 mg | PEN_INJECTOR | SUBCUTANEOUS | 0 refills | Status: AC
  Filled 2023-07-22: qty 3, 28d supply, fill #0

## 2023-08-08 ENCOUNTER — Other Ambulatory Visit (HOSPITAL_BASED_OUTPATIENT_CLINIC_OR_DEPARTMENT_OTHER): Payer: Self-pay

## 2023-08-08 MED ORDER — OZEMPIC (1 MG/DOSE) 4 MG/3ML ~~LOC~~ SOPN
1.0000 mg | PEN_INJECTOR | SUBCUTANEOUS | 0 refills | Status: AC
Start: 2023-08-07 — End: ?
  Filled 2023-08-22: qty 3, 28d supply, fill #0

## 2023-08-22 ENCOUNTER — Other Ambulatory Visit (HOSPITAL_BASED_OUTPATIENT_CLINIC_OR_DEPARTMENT_OTHER): Payer: Self-pay

## 2023-09-11 ENCOUNTER — Other Ambulatory Visit (HOSPITAL_BASED_OUTPATIENT_CLINIC_OR_DEPARTMENT_OTHER): Payer: Self-pay

## 2023-09-11 MED ORDER — OZEMPIC (2 MG/DOSE) 8 MG/3ML ~~LOC~~ SOPN
2.0000 mg | PEN_INJECTOR | SUBCUTANEOUS | 0 refills | Status: DC
  Filled 2023-09-17: qty 3, 28d supply, fill #0

## 2023-09-17 ENCOUNTER — Other Ambulatory Visit (HOSPITAL_BASED_OUTPATIENT_CLINIC_OR_DEPARTMENT_OTHER): Payer: Self-pay

## 2023-10-09 ENCOUNTER — Other Ambulatory Visit (HOSPITAL_BASED_OUTPATIENT_CLINIC_OR_DEPARTMENT_OTHER): Payer: Self-pay

## 2023-10-09 MED ORDER — OZEMPIC (2 MG/DOSE) 8 MG/3ML ~~LOC~~ SOPN
2.0000 mg | PEN_INJECTOR | SUBCUTANEOUS | 0 refills | Status: DC
  Filled 2023-10-09 – 2023-11-07 (×2): qty 3, 28d supply, fill #0

## 2023-11-07 ENCOUNTER — Other Ambulatory Visit (HOSPITAL_BASED_OUTPATIENT_CLINIC_OR_DEPARTMENT_OTHER): Payer: Self-pay

## 2023-12-01 ENCOUNTER — Other Ambulatory Visit (HOSPITAL_BASED_OUTPATIENT_CLINIC_OR_DEPARTMENT_OTHER): Payer: Self-pay

## 2023-12-01 MED ORDER — OZEMPIC (2 MG/DOSE) 8 MG/3ML ~~LOC~~ SOPN
2.0000 | PEN_INJECTOR | SUBCUTANEOUS | 0 refills | Status: AC
  Filled 2023-12-01: qty 3, 28d supply, fill #0

## 2023-12-02 ENCOUNTER — Other Ambulatory Visit (HOSPITAL_BASED_OUTPATIENT_CLINIC_OR_DEPARTMENT_OTHER): Payer: Self-pay

## 2023-12-05 ENCOUNTER — Other Ambulatory Visit (HOSPITAL_BASED_OUTPATIENT_CLINIC_OR_DEPARTMENT_OTHER): Payer: Self-pay

## 2023-12-05 MED ORDER — SHINGRIX 50 MCG/0.5ML IM SUSR
0.5000 mL | Freq: Once | INTRAMUSCULAR | 0 refills | Status: DC
Start: 2023-12-05 — End: 2024-02-13
  Filled 2023-12-05: qty 0.5, 1d supply, fill #0

## 2023-12-22 ENCOUNTER — Other Ambulatory Visit (HOSPITAL_BASED_OUTPATIENT_CLINIC_OR_DEPARTMENT_OTHER): Payer: Self-pay

## 2023-12-22 MED ORDER — MOUNJARO 7.5 MG/0.5ML ~~LOC~~ SOAJ
7.5000 mg | SUBCUTANEOUS | 0 refills | Status: DC
  Filled 2023-12-22 – 2023-12-30 (×2): qty 2, 28d supply, fill #0

## 2023-12-30 ENCOUNTER — Other Ambulatory Visit (HOSPITAL_BASED_OUTPATIENT_CLINIC_OR_DEPARTMENT_OTHER): Payer: Self-pay

## 2024-01-20 ENCOUNTER — Other Ambulatory Visit (HOSPITAL_BASED_OUTPATIENT_CLINIC_OR_DEPARTMENT_OTHER): Payer: Self-pay

## 2024-01-20 MED ORDER — MOUNJARO 7.5 MG/0.5ML ~~LOC~~ SOAJ
7.5000 mg | SUBCUTANEOUS | 0 refills | Status: DC
  Filled 2024-01-20 – 2024-01-24 (×2): qty 2, 28d supply, fill #0

## 2024-01-21 ENCOUNTER — Other Ambulatory Visit (HOSPITAL_BASED_OUTPATIENT_CLINIC_OR_DEPARTMENT_OTHER): Payer: Self-pay

## 2024-01-23 ENCOUNTER — Other Ambulatory Visit (HOSPITAL_BASED_OUTPATIENT_CLINIC_OR_DEPARTMENT_OTHER): Payer: Self-pay

## 2024-01-24 ENCOUNTER — Other Ambulatory Visit (HOSPITAL_BASED_OUTPATIENT_CLINIC_OR_DEPARTMENT_OTHER): Payer: Self-pay

## 2024-01-29 ENCOUNTER — Other Ambulatory Visit (HOSPITAL_BASED_OUTPATIENT_CLINIC_OR_DEPARTMENT_OTHER): Payer: Self-pay

## 2024-02-11 ENCOUNTER — Other Ambulatory Visit (HOSPITAL_BASED_OUTPATIENT_CLINIC_OR_DEPARTMENT_OTHER): Payer: Self-pay

## 2024-02-11 MED ORDER — MOUNJARO 7.5 MG/0.5ML ~~LOC~~ SOAJ
7.5000 mg | SUBCUTANEOUS | 0 refills | Status: AC
  Filled 2024-02-11 – 2024-02-26 (×2): qty 2, 28d supply, fill #0

## 2024-02-13 ENCOUNTER — Other Ambulatory Visit (HOSPITAL_BASED_OUTPATIENT_CLINIC_OR_DEPARTMENT_OTHER): Payer: Self-pay

## 2024-02-26 ENCOUNTER — Other Ambulatory Visit (HOSPITAL_BASED_OUTPATIENT_CLINIC_OR_DEPARTMENT_OTHER): Payer: Self-pay

## 2024-02-27 ENCOUNTER — Other Ambulatory Visit (HOSPITAL_BASED_OUTPATIENT_CLINIC_OR_DEPARTMENT_OTHER): Payer: Self-pay

## 2024-04-17 ENCOUNTER — Other Ambulatory Visit (HOSPITAL_BASED_OUTPATIENT_CLINIC_OR_DEPARTMENT_OTHER): Payer: Self-pay
